# Patient Record
Sex: Female | Born: 1990 | Hispanic: Yes | Marital: Single | Smoking: Never smoker
Health system: Southern US, Community
[De-identification: ages and names within clinical notes are randomized; demographics above are authoritative.]

## PROBLEM LIST (undated history)

## (undated) ENCOUNTER — Other Ambulatory Visit

## (undated) ENCOUNTER — Encounter

## (undated) ENCOUNTER — Ambulatory Visit

## (undated) ENCOUNTER — Telehealth

## (undated) ENCOUNTER — Ambulatory Visit: Attending: Family | Primary: Family

## (undated) ENCOUNTER — Encounter: Attending: Adult Health | Primary: Adult Health

## (undated) ENCOUNTER — Ambulatory Visit: Payer: MEDICAID

## (undated) ENCOUNTER — Ambulatory Visit: Payer: PRIVATE HEALTH INSURANCE

## (undated) ENCOUNTER — Ambulatory Visit: Payer: Medicaid (Managed Care)

## (undated) ENCOUNTER — Inpatient Hospital Stay

## (undated) ENCOUNTER — Encounter
Attending: Student in an Organized Health Care Education/Training Program | Primary: Student in an Organized Health Care Education/Training Program

## (undated) ENCOUNTER — Ambulatory Visit
Payer: Medicaid (Managed Care) | Attending: Student in an Organized Health Care Education/Training Program | Primary: Student in an Organized Health Care Education/Training Program

## (undated) DIAGNOSIS — K529 Noninfective gastroenteritis and colitis, unspecified: Secondary | ICD-10-CM

## (undated) DIAGNOSIS — K649 Unspecified hemorrhoids: Secondary | ICD-10-CM

## (undated) DIAGNOSIS — F419 Anxiety disorder, unspecified: Secondary | ICD-10-CM

## (undated) DIAGNOSIS — K2970 Gastritis, unspecified, without bleeding: Secondary | ICD-10-CM

## (undated) DIAGNOSIS — D649 Anemia, unspecified: Secondary | ICD-10-CM

## (undated) DIAGNOSIS — K5120 Ulcerative (chronic) proctitis without complications: Secondary | ICD-10-CM

## (undated) HISTORY — DX: Unspecified hemorrhoids: K64.9

## (undated) HISTORY — PX: NO PAST SURGERIES: SHX2092

## (undated) HISTORY — DX: Anxiety disorder, unspecified: F41.9

## (undated) HISTORY — DX: Ulcerative (chronic) proctitis without complications: K51.20

## (undated) HISTORY — DX: Noninfective gastroenteritis and colitis, unspecified: K52.9

## (undated) HISTORY — DX: Gastritis, unspecified, without bleeding: K29.70

---
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
## 2008-12-31 ENCOUNTER — Inpatient Hospital Stay (HOSPITAL_COMMUNITY): Admission: AD | Admit: 2008-12-31 | Discharge: 2008-12-31 | Payer: Self-pay | Admitting: Obstetrics & Gynecology

## 2009-01-22 ENCOUNTER — Emergency Department (HOSPITAL_COMMUNITY): Admission: EM | Admit: 2009-01-22 | Discharge: 2009-01-22 | Payer: Self-pay | Admitting: Emergency Medicine

## 2009-07-30 ENCOUNTER — Emergency Department (HOSPITAL_COMMUNITY): Admission: EM | Admit: 2009-07-30 | Discharge: 2009-07-30 | Payer: Self-pay | Admitting: Emergency Medicine

## 2011-04-08 LAB — URINALYSIS, MICROSCOPIC ONLY
Glucose, UA: NEGATIVE mg/dL
Ketones, ur: NEGATIVE mg/dL
Nitrite: NEGATIVE
Specific Gravity, Urine: 1.023 (ref 1.005–1.030)
Urobilinogen, UA: 1 mg/dL (ref 0.0–1.0)
pH: 7 (ref 5.0–8.0)

## 2011-04-08 LAB — WET PREP, GENITAL
Clue Cells Wet Prep HPF POC: NONE SEEN
Yeast Wet Prep HPF POC: NONE SEEN

## 2011-04-08 LAB — GC/CHLAMYDIA PROBE AMP, GENITAL: GC Probe Amp, Genital: NEGATIVE

## 2011-04-08 LAB — POCT PREGNANCY, URINE: Preg Test, Ur: NEGATIVE

## 2011-04-16 LAB — POCT I-STAT, CHEM 8
BUN: 10 mg/dL (ref 6–23)
Calcium, Ion: 1.12 mmol/L (ref 1.12–1.32)
Chloride: 105 mEq/L (ref 96–112)
Creatinine, Ser: 0.5 mg/dL (ref 0.4–1.2)
Glucose, Bld: 99 mg/dL (ref 70–99)
HCT: 39 % (ref 36.0–46.0)
Potassium: 3.8 mEq/L (ref 3.5–5.1)

## 2011-04-16 LAB — URINALYSIS, ROUTINE W REFLEX MICROSCOPIC
Bilirubin Urine: NEGATIVE
Glucose, UA: NEGATIVE mg/dL
Hgb urine dipstick: NEGATIVE
Ketones, ur: NEGATIVE mg/dL
Ketones, ur: NEGATIVE mg/dL
Nitrite: NEGATIVE
Protein, ur: NEGATIVE mg/dL
Specific Gravity, Urine: 1.015 (ref 1.005–1.030)
Specific Gravity, Urine: 1.016 (ref 1.005–1.030)
Urobilinogen, UA: 0.2 mg/dL (ref 0.0–1.0)
pH: 7.5 (ref 5.0–8.0)

## 2011-04-16 LAB — URINE MICROSCOPIC-ADD ON

## 2011-04-16 LAB — CBC
Platelets: 274 10*3/uL (ref 150–400)
RBC: 3.78 MIL/uL — ABNORMAL LOW (ref 3.87–5.11)
RDW: 12.2 % (ref 11.5–15.5)

## 2011-04-16 LAB — GC/CHLAMYDIA PROBE AMP, GENITAL: GC Probe Amp, Genital: NEGATIVE

## 2011-04-16 LAB — WET PREP, GENITAL: Clue Cells Wet Prep HPF POC: NONE SEEN

## 2011-08-31 ENCOUNTER — Inpatient Hospital Stay (HOSPITAL_COMMUNITY)
Admission: AD | Admit: 2011-08-31 | Discharge: 2011-08-31 | Disposition: A | Discharge status: Home / Self Care | Payer: Self-pay | Source: Ambulatory Visit | Attending: Obstetrics and Gynecology | Admitting: Obstetrics and Gynecology

## 2011-08-31 ENCOUNTER — Encounter (HOSPITAL_COMMUNITY): Payer: Self-pay | Admitting: *Deleted

## 2011-08-31 DIAGNOSIS — N760 Acute vaginitis: Secondary | ICD-10-CM | POA: Insufficient documentation

## 2011-08-31 LAB — URINALYSIS, ROUTINE W REFLEX MICROSCOPIC
Glucose, UA: NEGATIVE mg/dL
Nitrite: NEGATIVE
Protein, ur: NEGATIVE mg/dL
Specific Gravity, Urine: >1.03 — ABNORMAL HIGH (ref 1.005–1.030)
Urobilinogen, UA: 0.2 mg/dL (ref 0.0–1.0)
pH: 5.5 (ref 5.0–8.0)

## 2011-08-31 LAB — URINE MICROSCOPIC-ADD ON

## 2011-08-31 LAB — WET PREP, GENITAL
Trich, Wet Prep: NONE SEEN
Yeast Wet Prep HPF POC: NONE SEEN

## 2011-08-31 MED ORDER — METRONIDAZOLE 500 MG PO TABS: 500.0000 mg | ORAL_TABLET | Freq: Two times a day (BID) | ORAL | Status: DC

## 2011-08-31 MED ORDER — NAPROXEN SODIUM 550 MG PO TABS: 550.0000 mg | ORAL_TABLET | Freq: Two times a day (BID) | ORAL | Status: DC

## 2011-08-31 NOTE — Progress Notes (Signed)
Pt states she has been having lower abd pain since Monday morning @ 0500, crampy, "like contractions."  Back pain since yesterday.  No bleeding, has a white discharge.

## 2011-08-31 NOTE — ED Provider Notes (Signed)
History   Pt presents today c/o lower abd pain and vag dc. She states she thinks she is pregnant because she has "heard noises in her stomach." She states she had a normal menses that ended 5 days ago.  Chief Complaint  Patient presents with  . Abdominal Pain   HPI  OB History    Grav Para Term Preterm Abortions TAB SAB Ect Mult Living   3 2 2  1  1   2       Past Medical History  Diagnosis Date  . No pertinent past medical history     Past Surgical History  Procedure Date  . No past surgeries     No family history on file.  History  Substance Use Topics  . Smoking status: Never Smoker   . Smokeless tobacco: Not on file  . Alcohol Use: No    Allergies: No Known Allergies  Prescriptions prior to admission  Medication Sig Dispense Refill  . Etonogestrel (IMPLANON) 68 MG IMPL Inject 1 each into the skin once. Pt states that she had this implanted on August 3rd.2011         Review of Systems  Constitutional: Negative for fever.  Cardiovascular: Negative for chest pain.  Gastrointestinal: Positive for abdominal pain. Negative for nausea, vomiting, diarrhea and constipation.  Genitourinary: Negative for dysuria, urgency, frequency and hematuria.  Neurological: Negative for dizziness and headaches.  Psychiatric/Behavioral: Negative for depression and suicidal ideas.   Physical Exam   Blood pressure 113/66, pulse 79, temperature 98.6 F (37 C), temperature source Oral, resp. rate 20, height 5\' 2"  (1.575 m), weight 133 lb 6.4 oz (60.51 kg), last menstrual period 08/22/2011.  Physical Exam  Constitutional: She is oriented to person, place, and time. She appears well-developed and well-nourished. No distress.  HENT:  Head: Normocephalic and atraumatic.  Eyes: EOM are normal. Pupils are equal, round, and reactive to light.  GI: Soft. She exhibits no distension. There is no tenderness. There is no rebound and no guarding.  Genitourinary: No bleeding around the vagina.  Vaginal discharge found.       Uterus is NL size and shape. No adnexal masses. Pt is non-tender on exam.  Neurological: She is alert and oriented to person, place, and time.  Skin: Skin is warm and dry. She is not diaphoretic.  Psychiatric: She has a normal mood and affect. Her behavior is normal. Judgment and thought content normal.    MAU Course  Procedures  Wet prep and GC/Chlamydia cultures done.  Results for orders placed during the hospital encounter of 08/31/11 (from the past 24 hour(s))  URINALYSIS, ROUTINE W REFLEX MICROSCOPIC     Status: Abnormal   Collection Time   08/31/11  9:00 AM      Component Value Range   Color, Urine YELLOW  YELLOW    Appearance CLEAR  CLEAR    Specific Gravity, Urine >1.030 (*) 1.005 - 1.030    pH 5.5  5.0 - 8.0    Glucose, UA NEGATIVE  NEGATIVE (mg/dL)   Hgb urine dipstick NEGATIVE  NEGATIVE    Bilirubin Urine NEGATIVE  NEGATIVE    Ketones, ur NEGATIVE  NEGATIVE (mg/dL)   Protein, ur NEGATIVE  NEGATIVE (mg/dL)   Urobilinogen, UA 0.2  0.0 - 1.0 (mg/dL)   Nitrite NEGATIVE  NEGATIVE    Leukocytes, UA SMALL (*) NEGATIVE   URINE MICROSCOPIC-ADD ON     Status: Abnormal   Collection Time   08/31/11  9:00 AM  Component Value Range   Squamous Epithelial / LPF MANY (*) RARE    WBC, UA 11-20  <3 (WBC/hpf)  POCT PREGNANCY, URINE     Status: Normal   Collection Time   08/31/11  9:15 AM      Component Value Range   Preg Test, Ur NEGATIVE    WET PREP, GENITAL     Status: Abnormal   Collection Time   08/31/11  9:35 AM      Component Value Range   Yeast, Wet Prep NONE SEEN  NONE SEEN    Trich, Wet Prep NONE SEEN  NONE SEEN    Clue Cells, Wet Prep NONE SEEN  NONE SEEN    WBC, Wet Prep HPF POC MANY (*) NONE SEEN    Urine sent for culture.  Assessment and Plan  Abd pain/vaginitis: discussed with pt at length. Will give Rx for flagyl and anaprox ds. Warned of antabuse reaction. Discussed diet, activity, risks, and precautions. She will f/u with her  PCP.  Clinton Gallant. Rice III, DrHSc, MPAS, PA-C  08/31/2011, 9:35 AM

## 2011-08-31 NOTE — Progress Notes (Signed)
Pains started in lower abd  On Mon morning.  Have cont, feels crampy like contractions.  Bled 5 days stopped on Sun.

## 2011-08-31 NOTE — Progress Notes (Signed)
Some nausea, has been constipated.  Hears a noise in her stomach

## 2011-09-01 LAB — URINE CULTURE: Colony Count: NO GROWTH

## 2011-09-04 ENCOUNTER — Inpatient Hospital Stay (HOSPITAL_COMMUNITY)
Admission: AD | Admit: 2011-09-04 | Discharge: 2011-09-04 | Disposition: A | Discharge status: Home / Self Care | Payer: Self-pay | Source: Ambulatory Visit | Attending: Obstetrics & Gynecology | Admitting: Obstetrics & Gynecology

## 2011-09-04 ENCOUNTER — Encounter (HOSPITAL_COMMUNITY): Payer: Self-pay | Admitting: *Deleted

## 2011-09-04 DIAGNOSIS — A088 Other specified intestinal infections: Secondary | ICD-10-CM | POA: Insufficient documentation

## 2011-09-04 DIAGNOSIS — K5289 Other specified noninfective gastroenteritis and colitis: Secondary | ICD-10-CM

## 2011-09-04 DIAGNOSIS — N390 Urinary tract infection, site not specified: Secondary | ICD-10-CM

## 2011-09-04 DIAGNOSIS — K529 Noninfective gastroenteritis and colitis, unspecified: Secondary | ICD-10-CM

## 2011-09-04 LAB — CBC
HCT: 31.5 % — ABNORMAL LOW (ref 36.0–46.0)
Hemoglobin: 10.1 g/dL — ABNORMAL LOW (ref 12.0–15.0)
MCH: 25.5 pg — ABNORMAL LOW (ref 26.0–34.0)
MCHC: 32.1 g/dL (ref 30.0–36.0)
MCV: 79.5 fL (ref 78.0–100.0)
RDW: 15 % (ref 11.5–15.5)

## 2011-09-04 LAB — COMPREHENSIVE METABOLIC PANEL
AST: 14 U/L (ref 0–37)
Albumin: 3.2 g/dL — ABNORMAL LOW (ref 3.5–5.2)
BUN: 12 mg/dL (ref 6–23)
Calcium: 8.6 mg/dL (ref 8.4–10.5)
Chloride: 108 mEq/L (ref 96–112)
Creatinine, Ser: 0.59 mg/dL (ref 0.50–1.10)
Total Bilirubin: 0.1 mg/dL — ABNORMAL LOW (ref 0.3–1.2)
Total Protein: 6.6 g/dL (ref 6.0–8.3)

## 2011-09-04 LAB — URINALYSIS, ROUTINE W REFLEX MICROSCOPIC
Glucose, UA: 100 mg/dL — AB
Ketones, ur: 15 mg/dL — AB
Leukocytes, UA: NEGATIVE
Nitrite: POSITIVE — AB
Protein, ur: NEGATIVE mg/dL

## 2011-09-04 LAB — URINE MICROSCOPIC-ADD ON

## 2011-09-04 LAB — DIFFERENTIAL
Basophils Absolute: 0 10*3/uL (ref 0.0–0.1)
Basophils Relative: 0 % (ref 0–1)
Eosinophils Absolute: 0.5 10*3/uL (ref 0.0–0.7)
Eosinophils Relative: 5 % (ref 0–5)
Monocytes Absolute: 1 10*3/uL (ref 0.1–1.0)
Monocytes Relative: 9 % (ref 3–12)
Neutro Abs: 7.3 10*3/uL (ref 1.7–7.7)

## 2011-09-04 LAB — POCT PREGNANCY, URINE: Preg Test, Ur: NEGATIVE

## 2011-09-04 MED ORDER — LOPERAMIDE HCL 2 MG PO CAPS
2.0000 mg | ORAL_CAPSULE | Freq: Once | ORAL | Status: AC
  Administered 2011-09-04: 2 mg via ORAL
  Filled 2011-09-04: qty 1

## 2011-09-04 MED ORDER — LACTATED RINGERS IV BOLUS (SEPSIS)
1000.0000 mL | Freq: Once | INTRAVENOUS | Status: AC
  Administered 2011-09-04 (×2): 1000 mL via INTRAVENOUS

## 2011-09-04 MED ORDER — LOPERAMIDE HCL 2 MG PO CAPS
4.0000 mg | ORAL_CAPSULE | Freq: Once | ORAL | Status: AC
  Administered 2011-09-04: 4 mg via ORAL
  Filled 2011-09-04: qty 2

## 2011-09-04 MED ORDER — CEPHALEXIN 500 MG PO CAPS: 500.0000 mg | ORAL_CAPSULE | Freq: Four times a day (QID) | ORAL | Status: AC

## 2011-09-04 NOTE — ED Notes (Signed)
Adam, PA-S in to see patient.

## 2011-09-04 NOTE — ED Notes (Signed)
Stools documented in occurrence, not amount. Pt. Has had 2 loose stools since arrival to MAU. First stool was bloody; noted by tech and RN. 2nd stool, patient states she saw no apparent blood. C/O stomach cramps.

## 2011-09-04 NOTE — Progress Notes (Signed)
Pain in abd. , nausea, bloody diarrhea, dizziness, pain with urination X 4 days. States has had pain for 10 days. Was seen in MAu, given antibiotic.

## 2011-09-04 NOTE — Progress Notes (Signed)
Pt states she was seen in MAU on 8-31 and treated for some type of infection, pt is not sure what, and given pain medication. Pt states she is having abdominal pain not relieved by the pain med, nausea and vomiting and bloody diarrhea multiple times each day.

## 2011-09-04 NOTE — ED Provider Notes (Signed)
History   Pt presents today c/o bloody diarrhea and abd cramping for the past 4 days. She was recently seen for vaginitis and abd pain and treated with Flagyl. She denies fever but does c/o some nausea.  Chief Complaint  Patient presents with  . Abdominal Pain   HPI  OB History    Grav Para Term Preterm Abortions TAB SAB Ect Mult Living   3 2 2  1  1   2       Past Medical History  Diagnosis Date  . No pertinent past medical history     Past Surgical History  Procedure Date  . No past surgeries     No family history on file.  History  Substance Use Topics  . Smoking status: Never Smoker   . Smokeless tobacco: Never Used  . Alcohol Use: No    Allergies: No Known Allergies  Prescriptions prior to admission  Medication Sig Dispense Refill  . Etonogestrel (IMPLANON) 68 MG IMPL Inject 1 each into the skin once. Pt states that she had this implanted on August 3rd.2011       . metroNIDAZOLE (FLAGYL) 500 MG tablet Take 500 mg by mouth 2 (two) times daily.        . naproxen sodium (ANAPROX) 550 MG tablet Take 550 mg by mouth 2 (two) times daily with a meal.        . DISCONTD: metroNIDAZOLE (FLAGYL) 500 MG tablet Take 1 tablet (500 mg total) by mouth 2 (two) times daily.  14 tablet  0  . DISCONTD: naproxen sodium (ANAPROX DS) 550 MG tablet Take 1 tablet (550 mg total) by mouth 2 (two) times daily with a meal.  30 tablet  0    Review of Systems  Constitutional: Negative for fever and chills.  Respiratory: Negative for cough, hemoptysis, sputum production and shortness of breath.   Cardiovascular: Negative for chest pain and palpitations.  Gastrointestinal: Positive for nausea, abdominal pain, diarrhea and blood in stool. Negative for vomiting.  Genitourinary: Negative for dysuria, urgency, frequency and hematuria.  Neurological: Negative for dizziness and headaches.  Psychiatric/Behavioral: Negative for depression and suicidal ideas.   Physical Exam   Blood pressure 98/57,  pulse 80, temperature 99 F (37.2 C), temperature source Oral, resp. rate 20, height 5\' 2"  (1.575 m), weight 136 lb (61.689 kg), last menstrual period 08/22/2011, SpO2 100.00%.  Physical Exam  Constitutional: She is oriented to person, place, and time. She appears well-developed and well-nourished. No distress.  HENT:  Head: Normocephalic and atraumatic.  GI: Soft. She exhibits no distension and no mass. There is tenderness in the suprapubic area. There is no rigidity, no rebound, no guarding, no CVA tenderness, no tenderness at McBurney's point and negative Murphy's sign.  Genitourinary:       Rectal exam NL with no obvious blood. No evidence of hemorrhoids or fissures.  Neurological: She is alert and oriented to person, place, and time.  Skin: Skin is warm and dry. She is not diaphoretic.  Psychiatric: She has a normal mood and affect. Her behavior is normal. Thought content normal.    MAU Course  Procedures  Results for orders placed during the hospital encounter of 09/04/11 (from the past 24 hour(s))  URINALYSIS, ROUTINE W REFLEX MICROSCOPIC     Status: Abnormal   Collection Time   09/04/11 10:00 AM      Component Value Range   Color, Urine YELLOW  YELLOW    Appearance CLEAR  CLEAR  Specific Gravity, Urine >1.030 (*) 1.005 - 1.030    pH 5.5  5.0 - 8.0    Glucose, UA 100 (*) NEGATIVE (mg/dL)   Hgb urine dipstick NEGATIVE  NEGATIVE    Bilirubin Urine NEGATIVE  NEGATIVE    Ketones, ur 15 (*) NEGATIVE (mg/dL)   Protein, ur NEGATIVE  NEGATIVE (mg/dL)   Urobilinogen, UA 0.2  0.0 - 1.0 (mg/dL)   Nitrite POSITIVE (*) NEGATIVE    Leukocytes, UA NEGATIVE  NEGATIVE   COMPREHENSIVE METABOLIC PANEL     Status: Abnormal   Collection Time   09/04/11 10:00 AM      Component Value Range   Sodium 140  135 - 145 (mEq/L)   Potassium 3.8  3.5 - 5.1 (mEq/L)   Chloride 108  96 - 112 (mEq/L)   CO2 28  19 - 32 (mEq/L)   Glucose, Bld 88  70 - 99 (mg/dL)   BUN 12  6 - 23 (mg/dL)   Creatinine, Ser  1.61  0.50 - 1.10 (mg/dL)   Calcium 8.6  8.4 - 09.6 (mg/dL)   Total Protein 6.6  6.0 - 8.3 (g/dL)   Albumin 3.2 (*) 3.5 - 5.2 (g/dL)   AST 14  0 - 37 (U/L)   ALT 8  0 - 35 (U/L)   Alkaline Phosphatase 92  39 - 117 (U/L)   Total Bilirubin 0.1 (*) 0.3 - 1.2 (mg/dL)   GFR calc non Af Amer >60  >60 (mL/min)   GFR calc Af Amer >60  >60 (mL/min)  URINE MICROSCOPIC-ADD ON     Status: Abnormal   Collection Time   09/04/11 10:00 AM      Component Value Range   Squamous Epithelial / LPF MANY (*) RARE    WBC, UA 3-6  <3 (WBC/hpf)   RBC / HPF 7-10  <3 (RBC/hpf)   Bacteria, UA FEW (*) RARE   CBC     Status: Abnormal   Collection Time   09/04/11 10:10 AM      Component Value Range   WBC 10.2  4.0 - 10.5 (K/uL)   RBC 3.96  3.87 - 5.11 (MIL/uL)   Hemoglobin 10.1 (*) 12.0 - 15.0 (g/dL)   HCT 04.5 (*) 40.9 - 46.0 (%)   MCV 79.5  78.0 - 100.0 (fL)   MCH 25.5 (*) 26.0 - 34.0 (pg)   MCHC 32.1  30.0 - 36.0 (g/dL)   RDW 81.1  91.4 - 78.2 (%)   Platelets 313  150 - 400 (K/uL)  DIFFERENTIAL     Status: Normal   Collection Time   09/04/11 10:10 AM      Component Value Range   Neutrophils Relative 72  43 - 77 (%)   Neutro Abs 7.3  1.7 - 7.7 (K/uL)   Lymphocytes Relative 14  12 - 46 (%)   Lymphs Abs 1.4  0.7 - 4.0 (K/uL)   Monocytes Relative 9  3 - 12 (%)   Monocytes Absolute 1.0  0.1 - 1.0 (K/uL)   Eosinophils Relative 5  0 - 5 (%)   Eosinophils Absolute 0.5  0.0 - 0.7 (K/uL)   Basophils Relative 0  0 - 1 (%)   Basophils Absolute 0.0  0.0 - 0.1 (K/uL)  POCT PREGNANCY, URINE     Status: Normal   Collection Time   09/04/11 10:43 AM      Component Value Range   Preg Test, Ur NEGATIVE      Stool culture done.  Urine  sent for culture. Assessment and Plan  Viral gastroenteritis: discussed with pt at length. She will continue with OTC Imodium at home. She will be contacted if she has a positive stool culture.  UTI: will give Rx for keflex. Will await urine culture. Discussed diet, activity, risks,  and precautions.  Clinton Gallant. Rice III, DrHSc, MPAS, PA-C  09/04/2011, 1:03 PM   Henrietta Hoover, PA 09/04/11 1308

## 2011-09-05 LAB — URINE CULTURE: Colony Count: 30000

## 2011-09-06 NOTE — ED Provider Notes (Signed)
Agree with above note.  Linda Taylor H. 09/06/2011 11:21 AM

## 2011-09-07 LAB — STOOL CULTURE

## 2011-09-13 NOTE — ED Provider Notes (Signed)
Agree with above note.  Linda Taylor 09/13/2011 8:32 AM

## 2011-09-23 ENCOUNTER — Emergency Department (HOSPITAL_COMMUNITY): Payer: Self-pay

## 2011-09-23 ENCOUNTER — Emergency Department (HOSPITAL_COMMUNITY)
Admission: EM | Admit: 2011-09-23 | Discharge: 2011-09-23 | Disposition: A | Discharge status: Home / Self Care | Payer: Self-pay | Attending: Emergency Medicine | Admitting: Emergency Medicine

## 2011-09-23 DIAGNOSIS — N898 Other specified noninflammatory disorders of vagina: Secondary | ICD-10-CM | POA: Insufficient documentation

## 2011-09-23 DIAGNOSIS — R109 Unspecified abdominal pain: Secondary | ICD-10-CM | POA: Insufficient documentation

## 2011-09-23 LAB — URINALYSIS, ROUTINE W REFLEX MICROSCOPIC
Ketones, ur: 15 mg/dL — AB
Nitrite: NEGATIVE
Protein, ur: 30 mg/dL — AB
pH: 5 (ref 5.0–8.0)

## 2011-09-23 LAB — WET PREP, GENITAL

## 2011-09-23 LAB — URINE MICROSCOPIC-ADD ON

## 2011-09-23 LAB — POCT PREGNANCY, URINE: Preg Test, Ur: NEGATIVE

## 2011-09-24 LAB — URINE CULTURE
Colony Count: 8000
Culture  Setup Time: 201209231126

## 2011-10-15 LAB — CBC
HCT: 35.2 — ABNORMAL LOW
HCT: 39.3
Hemoglobin: 13.5
MCHC: 34.3
MCHC: 34.3
MCV: 91.3
MCV: 91.5
Platelets: 191
RBC: 4.3
RDW: 14.1 — ABNORMAL HIGH

## 2011-10-15 LAB — RH IMMUNE GLOB WKUP(>/=20WKS)(NOT WOMEN'S HOSP)

## 2012-04-10 ENCOUNTER — Inpatient Hospital Stay (HOSPITAL_COMMUNITY)
Admission: AD | Admit: 2012-04-10 | Discharge: 2012-04-10 | Disposition: A | Discharge status: Home / Self Care | Payer: Self-pay | Source: Ambulatory Visit | Attending: Obstetrics and Gynecology | Admitting: Obstetrics and Gynecology

## 2012-04-10 ENCOUNTER — Encounter (HOSPITAL_COMMUNITY): Payer: Self-pay | Admitting: *Deleted

## 2012-04-10 DIAGNOSIS — N946 Dysmenorrhea, unspecified: Secondary | ICD-10-CM

## 2012-04-10 DIAGNOSIS — N949 Unspecified condition associated with female genital organs and menstrual cycle: Secondary | ICD-10-CM | POA: Insufficient documentation

## 2012-04-10 LAB — URINALYSIS, ROUTINE W REFLEX MICROSCOPIC
Glucose, UA: NEGATIVE mg/dL
Ketones, ur: NEGATIVE mg/dL
Protein, ur: NEGATIVE mg/dL
Urobilinogen, UA: 0.2 mg/dL (ref 0.0–1.0)

## 2012-04-10 LAB — WET PREP, GENITAL
Trich, Wet Prep: NONE SEEN
Yeast Wet Prep HPF POC: NONE SEEN

## 2012-04-10 LAB — POCT PREGNANCY, URINE: Preg Test, Ur: NEGATIVE

## 2012-04-10 MED ORDER — KETOROLAC TROMETHAMINE 60 MG/2ML IM SOLN
60.0000 mg | Freq: Once | INTRAMUSCULAR | Status: AC
  Administered 2012-04-10: 60 mg via INTRAMUSCULAR
  Filled 2012-04-10: qty 2

## 2012-04-10 NOTE — MAU Provider Note (Signed)
History     CSN: [id number]  Arrival date and time: 04/10/12 4098   First Provider Initiated Contact with Patient 04/10/12 1026      Chief Complaint  Patient presents with  . Vaginal Bleeding  . Abdominal Pain   HPI Linda Taylor is 21 y.o. [id number] presents for heavy vaginal bleeding with this period.  This period is on time and heavier than her normal cycles.  She is also having a jelly like discharge from her rectum that is bloody.  Denies constipation/diarrhea.  States bowel movements are normal.  She is bloated and having sharp abdominal pain.  Denies nausea and vomiting.  Lightheadedness.  Ate today at 7:30.  Has not tried anything for pain. Uses Implanon for contraception X 2 years.      Past Medical History  Diagnosis Date  . No pertinent past medical history     Past Surgical History  Procedure Date  . No past surgeries     History reviewed. No pertinent family history.  History  Substance Use Topics  . Smoking status: Never Smoker   . Smokeless tobacco: Never Used  . Alcohol Use: No    Allergies: No Known Allergies  Prescriptions prior to admission  Medication Sig Dispense Refill  . Etonogestrel (IMPLANON) 68 MG IMPL Inject 1 each into the skin once. Pt states that she had this implanted on August 3rd.2011       . naproxen sodium (ANAPROX) 550 MG tablet Take 550 mg by mouth 2 (two) times daily with a meal.          Review of Systems  Constitutional: Negative for fever and chills.  Gastrointestinal: Positive for abdominal pain. Negative for diarrhea and constipation.       + for rectal bleeding X 5 years  Genitourinary:       + for menstrual cramps   Physical Exam   Blood pressure 113/66, pulse 65, temperature 97.8 F (36.6 C), temperature source Oral, resp. rate 18, height 5\' 2"  (1.575 m), weight 67.586 kg (149 lb), last menstrual period 04/06/2012.  Physical Exam  Constitutional: She is oriented to person, place, and time. She appears  well-developed and well-nourished.  Neck: Normal range of motion.  Cardiovascular: Normal rate.   Respiratory: Effort normal.  GI: Soft. She exhibits no mass. There is tenderness (lower mid abdomen). There is no rebound and no guarding.  Genitourinary: Rectum normal. Rectal exam shows no external hemorrhoid and no tenderness. Uterus is tender. Uterus is not enlarged. Cervix exhibits no discharge. Right adnexum displays no mass, no tenderness and no fullness. Left adnexum displays no mass, no tenderness and no fullness. There is bleeding around the vagina.       + for menstrual bleeding.  Neurological: She is alert and oriented to person, place, and time.  Skin: Skin is warm and dry.  Psychiatric: She has a normal mood and affect. Her behavior is normal.   Results for orders placed during the hospital encounter of 04/10/12 (from the past 24 hour(s))  URINALYSIS, ROUTINE W REFLEX MICROSCOPIC     Status: Abnormal   Collection Time   04/10/12  9:47 AM      Component Value Range   Color, Urine YELLOW  YELLOW    APPearance CLEAR  CLEAR    Specific Gravity, Urine >1.030 (*) 1.005 - 1.030    pH 6.0  5.0 - 8.0    Glucose, UA NEGATIVE  NEGATIVE (mg/dL)   Hgb urine dipstick MODERATE (*) NEGATIVE  Bilirubin Urine NEGATIVE  NEGATIVE    Ketones, ur NEGATIVE  NEGATIVE (mg/dL)   Protein, ur NEGATIVE  NEGATIVE (mg/dL)   Urobilinogen, UA 0.2  0.0 - 1.0 (mg/dL)   Nitrite NEGATIVE  NEGATIVE    Leukocytes, UA NEGATIVE  NEGATIVE   URINE MICROSCOPIC-ADD ON     Status: Abnormal   Collection Time   04/10/12  9:47 AM      Component Value Range   Squamous Epithelial / LPF FEW (*) RARE    RBC / HPF 7-10  <3 (RBC/hpf)  POCT PREGNANCY, URINE     Status: Normal   Collection Time   04/10/12 10:00 AM      Component Value Range   Preg Test, Ur NEGATIVE  NEGATIVE   WET PREP, GENITAL     Status: Abnormal   Collection Time   04/10/12 10:47 AM      Component Value Range   Yeast Wet Prep HPF POC NONE SEEN  NONE  SEEN    Trich, Wet Prep NONE SEEN  NONE SEEN    Clue Cells Wet Prep HPF POC NONE SEEN  NONE SEEN    WBC, Wet Prep HPF POC FEW (*) NONE SEEN     MAU Course  Procedures  GC/CHL culture to lab  MDM Toradol 60mg  IM ordered for pain 11:45  Patient states her pain is much less after Toradol injection.  Ready for discharge.  Assessment and Plan  A:  Dysmenorrhea      Hx of rectal bleeding without followup as instructed by precious provider (per patient report)  P:  May take Ibuprofen as needed for pain        Phone number for [facility] given to patient to make appointment    Sully Manzi,EVE M 04/10/2012, 10:29 AM

## 2012-04-10 NOTE — MAU Note (Signed)
Pt in c/o lower abdominal pain and bleeding since Sunday, today is worse.  States it is time for period but states period is much heavier than normal.  Reports blood in stool x5 years.

## 2012-04-10 NOTE — Discharge Instructions (Signed)
Dysmenorrhea Menstrual pain is caused by the muscles of the uterus tightening (contracting) during a menstrual period. The muscles of the uterus contract due to the chemicals in the uterine lining. Primary dysmenorrhea is menstrual cramps that last a couple of days when you start having menstrual periods or soon after. This often begins after a teenager starts having her period. As a woman gets older or has a baby, the cramps will usually lesson or disappear. Secondary dysmenorrhea begins later in life, lasts longer, and the pain may be stronger than primary dysmenorrhea. The pain may start before the period and last a few days after the period. This type of dysmenorrhea is usually caused by an underlying problem such as:  The tissue lining the uterus grows outside of the uterus in other areas of the body (endometriosis).   The endometrial tissue, which normally lines the uterus, is found in or grows into the muscular walls of the uterus (adenomyosis).   The pelvic blood vessels are engorged with blood just before the menstrual period (pelvic congestive syndrome).   Overgrowth of cells in the lining of the uterus or cervix (polyps of the uterus or cervix).   Falling down of the uterus (prolapse) because of loose or stretched ligaments.   Depression.   Bladder problems, infection, or inflammation.   Problems with the intestine, a tumor, or irritable bowel syndrome.   Cancer of the female organs or bladder.   A severely tipped uterus.   A very tight opening or closed cervix.   Noncancerous tumors of the uterus (fibroids).   Pelvic inflammatory disease (PID).   Pelvic scarring (adhesions) from a previous surgery.   Ovarian cyst.   An intrauterine device (IUD) used for birth control.  CAUSES  The cause of menstrual pain is often unknown. SYMPTOMS   Cramping or throbbing pain in your lower abdomen.   Sometimes, a woman may also experience headaches.   Lower back pain.    Feeling sick to your stomach (nausea) or vomiting.   Diarrhea.   Sweating or dizziness.  DIAGNOSIS  A diagnosis is based on your history, symptoms, physical examination, diagnostic tests, or procedures. Diagnostic tests or procedures may include:  Blood tests.   An ultrasound.   An examination of the lining of the uterus (dilation and curettage, D&C).   An examination inside your abdomen or pelvis with a scope (laparoscopy).   X-rays.   CT Scan.   MRI.   An examination inside the bladder with a scope (cystoscopy).   An examination inside the intestine or stomach with a scope (colonoscopy, gastroscopy).  TREATMENT  Treatment depends on the cause of the dysmenorrhea. Treatment may include:  Pain medicine prescribed by your caregiver.   Birth control pills.   Hormone replacement therapy.   Nonsteroidal anti-inflammatory drugs (NSAIDs). These may help stop the production of prostaglandins.   An IUD with progesterone hormone in it.   Acupuncture.   Surgery to remove adhesions, endometriosis, ovarian cyst, or fibroids.   Removal of the uterus (hysterectomy).   Progesterone shots to stop the menstrual period.   Cutting the nerves on the sacrum that go to the female organs (presacral neurectomy).   Electric currant to the sacral nerves (sacral nerve stimulation).   Antidepressant medicine.   Psychiatric therapy, counseling, or group therapy.   Exercise and physical therapy.   Meditation and yoga therapy.  HOME CARE INSTRUCTIONS   Only take over-the-counter or prescription medicines for pain, discomfort, or fever as directed by your   caregiver.   Place a heating pad or hot water bottle on your lower back or abdomen. Do not sleep with the heating pad.   Use aerobic exercises, walking, swimming, biking, and other exercises to help lessen the cramping.   Massage to the lower back or abdomen may help.   Stop smoking.   Avoid alcohol and caffeine.   Yoga,  meditation, or acupuncture may help.  SEEK MEDICAL CARE IF:   The pain does not get better with medicine.   You have pain with sexual intercourse.  SEEK IMMEDIATE MEDICAL CARE IF:   Your pain increases and is not controlled with medicines.   You have a fever.   You develop nausea or vomiting with your period not controlled with medicine.   You have abnormal vaginal bleeding with your period.   You pass out.  MAKE SURE YOU:   Understand these instructions.   Will watch your condition.   Will get help right away if you are not doing well or get worse.  Document Released: 12/17/2005 Document Revised: 12/06/2011 Document Reviewed: 04/04/2009 [employer] Patient Information 2012 [employer]. 

## 2012-04-11 LAB — GC/CHLAMYDIA PROBE AMP, GENITAL
Chlamydia, DNA Probe: NEGATIVE
GC Probe Amp, Genital: NEGATIVE

## 2012-04-14 NOTE — MAU Provider Note (Signed)
Agree with above note.  Linda Taylor 04/14/2012 1:40 PM

## 2012-05-12 ENCOUNTER — Encounter: Payer: Self-pay | Admitting: Family Medicine

## 2012-05-12 ENCOUNTER — Ambulatory Visit (INDEPENDENT_AMBULATORY_CARE_PROVIDER_SITE_OTHER): Payer: Self-pay | Admitting: Family Medicine

## 2012-05-12 VITALS — BP 113/65 | HR 70 | Temp 98.3°F | Ht 62.0 in | Wt 143.0 lb

## 2012-05-12 DIAGNOSIS — L709 Acne, unspecified: Secondary | ICD-10-CM | POA: Insufficient documentation

## 2012-05-12 DIAGNOSIS — N61 Mastitis without abscess: Secondary | ICD-10-CM

## 2012-05-12 DIAGNOSIS — K5120 Ulcerative (chronic) proctitis without complications: Secondary | ICD-10-CM | POA: Insufficient documentation

## 2012-05-12 DIAGNOSIS — K625 Hemorrhage of anus and rectum: Secondary | ICD-10-CM

## 2012-05-12 DIAGNOSIS — L708 Other acne: Secondary | ICD-10-CM

## 2012-05-12 DIAGNOSIS — N611 Abscess of the breast and nipple: Secondary | ICD-10-CM | POA: Insufficient documentation

## 2012-05-12 DIAGNOSIS — N63 Unspecified lump in unspecified breast: Secondary | ICD-10-CM

## 2012-05-12 NOTE — Assessment & Plan Note (Signed)
Small abscess. No systemic symptoms. Too small to drain/antibiotics.  Recommended warm compresses.

## 2012-05-12 NOTE — Patient Instructions (Signed)
It was great to see you today!  Schedule an appointment to see me as needed.  

## 2012-05-12 NOTE — Progress Notes (Signed)
  Subjective:   Patient ID: Linda Taylor, female DOB: 02/02/1991 21 y.o. MRN: [id number] HPI:  1. Rectal bleeding  Onset: has been chronic  Time period of: 5 year(s).  Severity is described as mild.  Course of her symptoms over time is chronic and constant. Aggravating: does not know what causes it.  Alleviating: unknown Associated sx/sn: mucous present with BM, occasional constipation.  No abdominal pain, no fever, no food allergies, no relation of symptoms to food. C/o gassy diarrhea at times. Never had colonoscopy.  No weight loss. 10 lbs weight gain since last year.  No fam. History of colon ca/inflammatory bowel disease. No rashes.   2. Right breat lump Small 0.5x0.5 cm abscess in the right breast lateral to the areola.  Onset: has been acute  Time period of: 3 day(s).  Severity is described as mild.  Course of her symptoms over time is acute. Aggravating: palpation Alleviating: none Associated sx/sn: no fevers, no weight loss.   History  Substance Use Topics  . Smoking status: Never Smoker   . Smokeless tobacco: Never Used  . Alcohol Use: No   Review of Systems: Pertinent items are noted in HPI. No abdominal pain, no fever, no food allergies, no relation of symptoms to food. C/o gassy diarrhea at times. Never had colonoscopy.  No weight loss. 10 lbs weight gain since last year.  No fam. History of colon ca/inflammatory bowel disease. No rashes.   Labs Reviewed: reviewed CBC from 2012. H/H 10/33    Objective:   Filed Vitals:   05/12/12 1613  BP: 113/65  Pulse: 70  Temp: 98.3 F (36.8 C)  TempSrc: Oral  Height: 5\' 2"  (1.575 m)  Weight: 143 lb (64.864 kg)   Physical Exam: General: hispanic female, acne, pleasant.  Anoscope: small 1 oclock internal hemorrhoid with punctate bleeding areas. No fissure, nontender exam.  Breast exam: Small 0.5x0.5 cm abscess in the right breast lateral to the areola.   Abdomen: soft and non-tender without masses,  organomegaly or hernias noted.  No guarding or rebound Extremities:   Non-tender, No cyanosis, edema, or deformity noted. Skin:  Intact without suspicious lesions or rashes  Assessment & Plan:

## 2012-05-12 NOTE — Assessment & Plan Note (Signed)
Avm, vs. Diverticulosis, vs. Juvenile polyp, vs. Internal hemmorhoid. Anoscope revealed small internal hemorrhoid. Patient should see GI again for a colonoscopy.

## 2012-05-19 ENCOUNTER — Ambulatory Visit (INDEPENDENT_AMBULATORY_CARE_PROVIDER_SITE_OTHER): Payer: Self-pay | Admitting: Family Medicine

## 2012-05-19 ENCOUNTER — Encounter: Payer: Self-pay | Admitting: Family Medicine

## 2012-05-19 ENCOUNTER — Other Ambulatory Visit: Payer: Self-pay | Admitting: Family Medicine

## 2012-05-19 VITALS — BP 116/60 | Temp 99.1°F | Ht 62.0 in | Wt 145.0 lb

## 2012-05-19 DIAGNOSIS — L709 Acne, unspecified: Secondary | ICD-10-CM

## 2012-05-19 DIAGNOSIS — L708 Other acne: Secondary | ICD-10-CM

## 2012-05-19 DIAGNOSIS — K625 Hemorrhage of anus and rectum: Secondary | ICD-10-CM

## 2012-05-19 MED ORDER — CLINDAMYCIN PHOS-BENZOYL PEROX 1-5 % EX GEL: Freq: Two times a day (BID) | CUTANEOUS | Status: DC

## 2012-05-19 NOTE — Patient Instructions (Signed)
Acn (Acne) El acn es un problema de la piel que causa granos. Aparece cuando los poros de la piel se obstruyen. Los poros Insurance account manager, hincharse y Cabin crew (inflamarse) o infectarse con una bacteria que habitualmente se encuentra en la piel (Propionibacterium acnes). El acn es un trastorno comn de la piel. El 80% de las personas sufre acn en algn momento. Es especialmente frecuente [employer] [address] y los [address]. Generalmente desaparece despus de un tiempo con el tratamiento adecuado.   CAUSAS Cada uno de los poros contiene una glndula sebcea. Esta glndula sebcea produce una sustancia grasosa llamada sebo. El acn aparece cuando estas glndulas se obstruyen con sebo, clulas de la piel muertas y suciedad. Entonces las bacterias P. acnes que normalmente se encuentran en las glndulas sebceas se multiplican y causan inflamacin. Con frecuencia el acn es originado por cambios hormonales. Estos cambios [employer] las glndulas sebceas se agranden y produzcan ms sebo. Los factores que empeoran el acn son:    Cambios hormonales durante la adolescencia.   Cambios hormonales en los ciclos menstruales femeninos.   Cambios hormonales Academic librarian.   Cosmticos y productos para el cabello con base de aceites.   Restregarse vigorosamente la piel.   Jabones fuertes.   Estrs.   Problemas hormonales debidos a ciertas enfermedades.   Cabello largo o grasoso que toca la piel.   Algunos medicamentos.   Presin por vinchas, mochilas u hombreras.   Exposicin a ciertos aceites y sustancias qumicas.  SNTOMAS El acn aparece con ms frecuencia en el rostro, el cuello, el pecho y la parte superior de la espalda. Los sntomas son:    Bultos pequeos, rojos(granos o ppulas).   Barritos (comedones cerrados).   Espinillas (comedones abiertos).   Granos pequeos llenos de pus (pstulas).   Granos o pstulas grandes y rojas que duelen.  El acn ms grave puede causar:     Infeccin en una zona en la que se acumula pus (absceso).   Sacos duros, dolorosos y llenos de lquido (quistes).   Cicatrices.  DIAGNSTICO   El mdico puede diagnosticar el problema haciendo un examen fsico.   TRATAMIENTO Existen muchos tratamientos buenos para el acn. Algunos estn disponibles como medicamentos de venta libre y otros son recetados. El mejor tratamiento para usted depende del tipo de acn que presente y de su gravedad. Puede llevar 2 meses de tratamiento antes de que el acn comience a mejorar. Los tratamientos ms comunes son:    Control and instrumentation engineer y lociones que evitan que las glndulas sebceas se obstruyan.   Cremas y lociones para tratar o prevenir infecciones e inflamacin.   Antibiticos-sobre la piel o por va oral.   Comprimidos que disminuyan la produccin de cebo.   Anticonceptivos.   Luces especiales o rayo lser.   Ciruga menor.   Medicamentos inyectables en las zonas de acn.   Sustancias qumicas que produzcan un peeling en la piel.  INSTRUCCIONES PARA EL CUIDADO DOMICILIARIO Un buen cuidado de la piel es lo ms importante del tratamiento.    Lave la piel suavemente al [employer] veces por da y luego de Education administrator actividad fsica. Limpie su piel siempre antes de irse a dormir.   Utilice un jabn suave.   Despus de lavarse, aplique una crema humectante a base de agua.   Mantenga el cabello limpio y fuera del rostro. Lvelo todos los das con [address].   Tome slo la medicacin que le indic el profesional.   Use pantalla solar con [employer]  30  ms. Esto es muy importante si toma medicamentos para curar el acn.   Elija cosmticos no comedognicos. Esto significa que no obstruyan las glndulas sebceas.   Evite apoyar la barbilla o la frente en las manos.   Evite el uso de vinchas o sombreros apretados.   Evite rascarse o apretar los granos. Esto puede hacer que el acn empeore y cause cicatrices.  SOLICITE ANTENCIN MDICA SI:  El acn no  mejora en 8 semanas.   El acn [place].   Observa una zona mayor de piel est roja o duele.  Document Released: 12/17/2005 Document Revised: 12/06/2011 [facility] Patient Information 2012 [place], [place].

## 2012-05-19 NOTE — Progress Notes (Signed)
  Subjective:   Patient ID: Linda Taylor, female DOB: 08-10-1991 21 y.o. MRN: [id number] HPI:  1. Acne vulgaris Synopsis: patient states she had no acne as a teen. Started when she was 20-19, 1.5 years ago. She also had Implanon placed 2 years ago.   Location: face, chest and back.  Onset: has been 1.5 years ago  Severity is described as moderate.  Aggravating: possibly the implanon Alleviating: oxy clean 10% Associated sx/sn: none  History  Substance Use Topics  . Smoking status: Never Smoker   . Smokeless tobacco: Never Used  . Alcohol Use: No    Review of Systems: Pertinent items are noted in HPI.  Labs Reviewed: none Reviewed Chart Review for last notes.     Objective:   Filed Vitals:   05/19/12 1613  BP: 116/60  Temp: 99.1 F (37.3 C)  TempSrc: Oral  Height: 5\' 2"  (1.575 m)  Weight: 145 lb (65.772 kg)   Physical Exam: General: hispanic female, nad, pleasant Skin:  Intact, acne on face with deep scars, chest and back. Few active pustules. Breasts: breasts appear normal, no suspicious masses, no skin or nipple changes or axillary nodes  Assessment & Plan:   1. Acne: Will prescribe Benzaclin.  There is a 14% chance of acne with Implanon. Her Implanon will be expired in one year, at which time I recommend removing it and using a copper IUD to avoid hormonal cause.

## 2012-06-06 ENCOUNTER — Telehealth (INDEPENDENT_AMBULATORY_CARE_PROVIDER_SITE_OTHER): Payer: Self-pay | Admitting: Family Medicine

## 2012-06-06 DIAGNOSIS — K625 Hemorrhage of anus and rectum: Secondary | ICD-10-CM

## 2012-06-06 NOTE — Telephone Encounter (Signed)
Apparently she doesn't have an appt with Dr Madolyn Frieze, so I changed the note to say to send in the hemocult cards to our office

## 2012-06-06 NOTE — Telephone Encounter (Signed)
I returned a phone call to the number in our chart regarding the referral for colonoscopy that Dr Rivka Safer had recommended, but no one answered. Chart review documents that he had done anoscopy for her reported rectal bleeding, but I don't see a documented guaiac positive stool. She has an appointment scheduled with Dr Madolyn Frieze 06/16/12. I recommend that she take ferrous sulfate 325 mg one tablet twice daily which is available over the counter, since she has a Hgb of 10 and low normal MCV. I will send her this recommendation and hemocult cards. If rectal bleeding is documented Dr Madolyn Frieze can refer her to [facility] when they are on call for evaluation.

## 2012-06-10 ENCOUNTER — Telehealth: Payer: Self-pay | Admitting: Family Medicine

## 2012-06-10 NOTE — Telephone Encounter (Signed)
I called pt two time and nobody answer the phone, answer machine not available to let msg. Marines

## 2012-06-16 ENCOUNTER — Ambulatory Visit: Payer: Self-pay | Admitting: Family Medicine

## 2012-06-18 ENCOUNTER — Telehealth: Payer: Self-pay | Admitting: *Deleted

## 2012-06-18 NOTE — Telephone Encounter (Signed)
Patient comes to office asking about referral for colonoscopy.Marland Kitchen Consulted with Dr. Sheffield Slider and noted his note from 06/06/2012 patient needs to fill out hemoccult cards and then start ferrous sulfate 325 mg one tablet twice daily. Also needs follow up appointment with Dr. Madolyn Frieze . Hemoccult cards given and explained  .  appointment scheduled for 06/27/2012 and interpretor scheduled. Patient states she is currently taking Ferrous Sulfate.

## 2012-06-19 NOTE — Telephone Encounter (Signed)
I been calling pt many times and no answer machine available.  Marines

## 2012-06-19 NOTE — Telephone Encounter (Signed)
She stopped by the office and Larita Fife gave her hemacult cards which she hopefully will send in to see if she is still bleeding. If she is, we likely will have to send her to a gastroenterologist in [place]. Thanks for trying to reach her so many times.

## 2012-06-23 LAB — HEMOCCULT GUIAC POC 1CARD (OFFICE): Card #3 Fecal Occult Blood, POC: POSITIVE

## 2012-06-23 NOTE — Addendum Note (Signed)
Addended by: [place], Vercie Pokorny on: 06/23/2012 04:51 PM   Modules accepted: Orders

## 2012-06-27 ENCOUNTER — Encounter: Payer: Self-pay | Admitting: Family Medicine

## 2012-06-27 ENCOUNTER — Ambulatory Visit (INDEPENDENT_AMBULATORY_CARE_PROVIDER_SITE_OTHER): Payer: Self-pay | Admitting: Family Medicine

## 2012-06-27 VITALS — BP 97/58 | HR 79 | Temp 98.0°F | Ht 62.0 in | Wt 144.7 lb

## 2012-06-27 DIAGNOSIS — K625 Hemorrhage of anus and rectum: Secondary | ICD-10-CM

## 2012-06-27 DIAGNOSIS — D649 Anemia, unspecified: Secondary | ICD-10-CM

## 2012-06-27 MED ORDER — POLYETHYLENE GLYCOL 3350 17 GM/SCOOP PO POWD: 17.0000 g | Freq: Two times a day (BID) | ORAL | Status: AC | PRN

## 2012-06-27 NOTE — Progress Notes (Signed)
Interpreter Wyvonnia Dusky for Dr Madolyn Frieze   Subjective:    Patient ID: Linda Taylor, female    DOB: 06-06-1991, 21 y.o.   MRN: [id number]  HPI Follow-up: blood in stool She has had blood in stool for about 5 years.  Every bowel movement is bloody.  No abdominal pain.  She has some constipation and has to strain with bowel movements.   Review of Systems She is complaining of some lightheadedness.  Past Medical History, Family History, Social History, Allergies, and Medications reviewed. No family history of bowel disease or colon cancer.    Objective:   Physical Exam GEN: NAD, well-nourished, well-appearing PSYCH: engaged, appropriate, Spanish-speaking primarily, pleasant, normal affect Rectal:    Small external hemorrhoids, non-inflamed   No internal hemorrhoids (visualized with anoscope) and no fissure   Non-tender during exam    Assessment & Plan:

## 2012-06-27 NOTE — Assessment & Plan Note (Addendum)
Will refer to GI in [place]. Unsure if patient will be able to see them due to limited finances. Patient has orange card. Encouraged taking iron 3 times daily to help with lightheadedness.  For constipation, Miralax, hydration, and high-fiber foods.  Follow-up as needed or in 6 months.   UPDATE: Will check CT-abdomen to rule-out obvious GI mass because I do not anticipate her getting colonoscopy any time soon. Will check PT/PTT/INR, peripheral smear.  Will need to call and inform patient to come in for testing and imaging.

## 2012-06-27 NOTE — Patient Instructions (Addendum)
Vamos a hacer una cita con una especialista de Programmer, applications.   Toma Miralax una cucharada dos veces al dia si tiene estrenimiento.  No se siente mucho tiempo en escusado (no mas de 5 minutos).  Toma mucho agua (4-8 vaso al dia).  Come mas comidas con fibra (legumbres, verduras, granos).   Regrese a Event organiser cuando puede por su papinocolao.   Mucho gusto.

## 2012-06-30 ENCOUNTER — Ambulatory Visit: Payer: Self-pay

## 2012-06-30 NOTE — Addendum Note (Signed)
Addended by: Madolyn Frieze, Marylene Land J on: 06/30/2012 10:49 AM   Modules accepted: Orders

## 2012-06-30 NOTE — Addendum Note (Signed)
Addended by: Madolyn Frieze, Marylene Land J on: 06/30/2012 10:51 AM   Modules accepted: Level of Service

## 2012-07-01 ENCOUNTER — Telehealth: Payer: Self-pay | Admitting: *Deleted

## 2012-07-01 NOTE — Telephone Encounter (Signed)
Message copied by Aram Beecham on Tue Jul 01, 2012 11:34 AM ------      Message from: [facility] PARK, ANGELA J      Created: Mon Jun 30, 2012 10:49 AM       1. BLUE TEAM: Will you schedule for CT abdomen for lower GI bleeding?            2. After we have time and date of CT, Marines:        1. Will you call and notify patient of this? And let her know that I want to take a picture of her belly because she is bleeding and it may be a while before she can get a colonoscopy.        2. Will you ask the patient to come in for labs? I want to see if she has a bleeding disorder that would make her more likely to bleed than normal people.         3. Will you ask her if she has heavy periods? If yes, how many pads/tampons a day and how long does her period last?            Thank you Marines.

## 2012-07-01 NOTE — Telephone Encounter (Signed)
CT scheduled for 07/07/12 at 12:45 at [facility] radiology, patient needs to come the day before to pick up contrast. Marines will you call and let patient know this as well as message from MD below. Thanks!

## 2012-07-02 NOTE — Telephone Encounter (Signed)
I called pt and phone is not available to lvm. I will try later.  Marines

## 2012-07-04 NOTE — Telephone Encounter (Signed)
Marines repeatedly unable to reach patient, CT rescheduled for 7/12 at 1pm. Marines could you please type up a letter for patient with her appointment info for [place] to mail. Thanks!

## 2012-07-07 ENCOUNTER — Other Ambulatory Visit (HOSPITAL_COMMUNITY): Payer: Self-pay

## 2012-07-07 NOTE — Telephone Encounter (Signed)
Pt is aware about her appt for Ct at [facility] on 7/12@12 :45  Marines

## 2012-07-11 ENCOUNTER — Other Ambulatory Visit (HOSPITAL_COMMUNITY): Payer: Self-pay

## 2012-08-03 IMAGING — US US TRANSVAGINAL NON-OB
1 series · 13 of 25 positions shown · non-contrast
Comparison: Prior ultrasound of pregnancy performed 06/12/2007

CLINICAL DATA: Generalized abdominal pain and diarrhea; assess for
tubo-ovarian abscess.

TRANSABDOMINAL AND TRANSVAGINAL ULTRASOUND OF PELVIS
TECHNIQUE: Both transabdominal and transvaginal ultrasound
examinations of the pelvis were performed. Transabdominal technique
was performed for global imaging of the pelvis including uterus,
ovaries, adnexal regions, and pelvic cul-de-sac.

[Series 1: us transvaginal non-ob · 0.20mm/px · 13 of 64 slices shown]
[im 1/64]
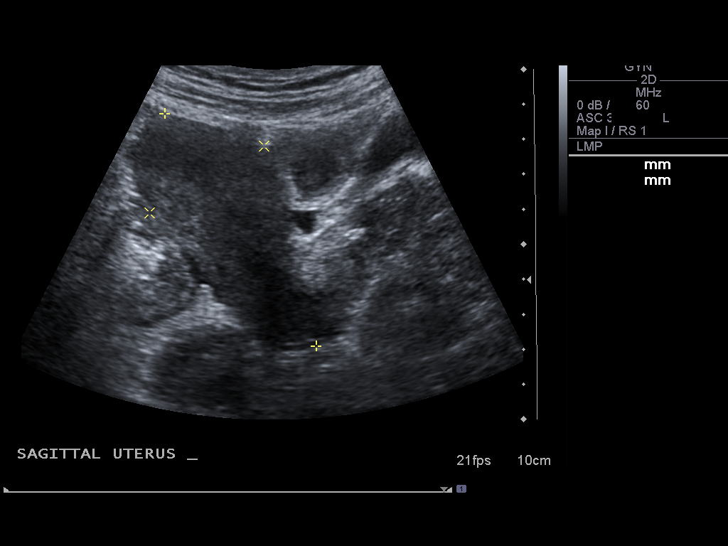
[im 6/64]
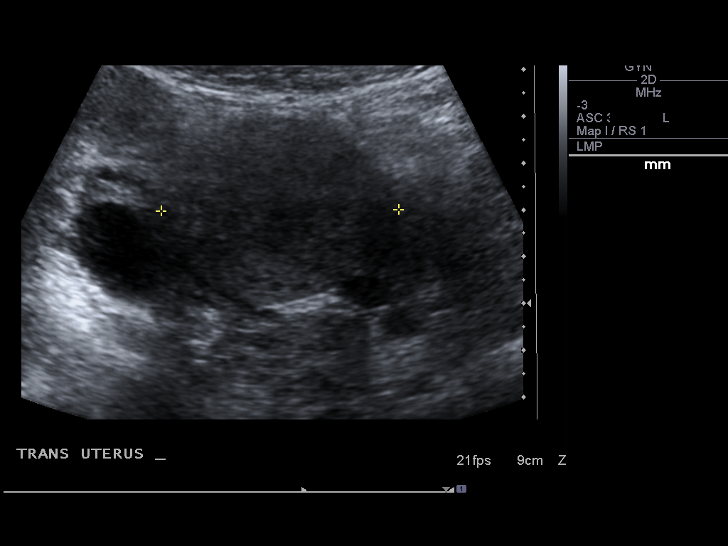
[im 11/64]
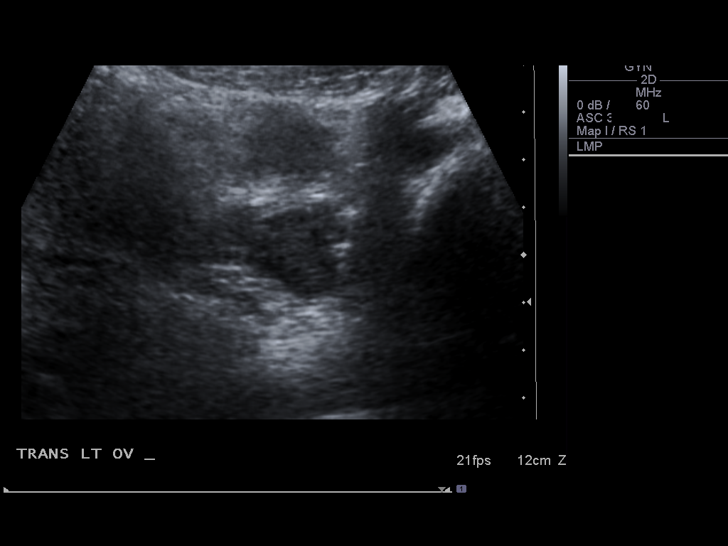
[im 16/64]
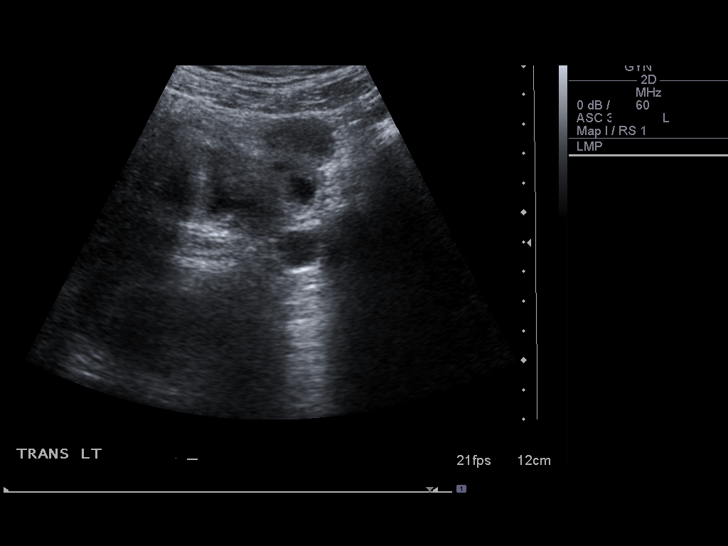
[im 22/64]
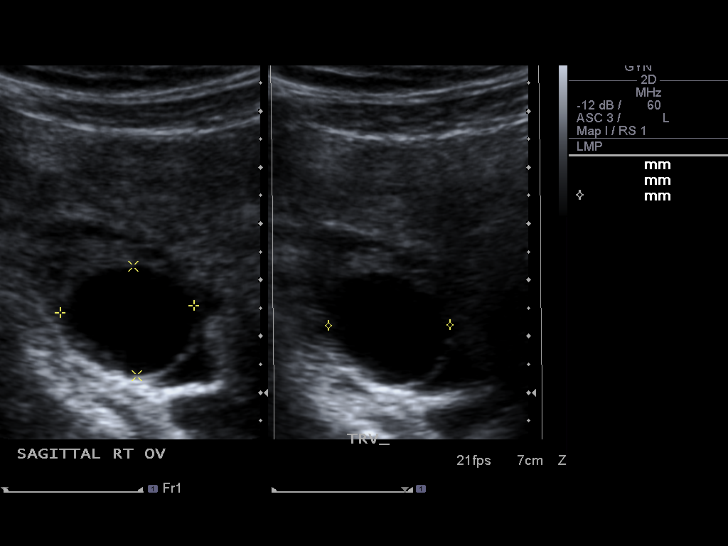
[im 27/64]
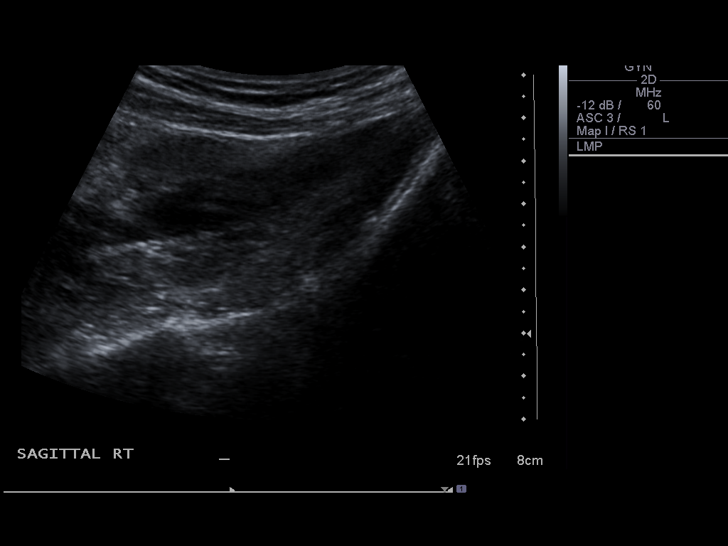
[im 32/64]
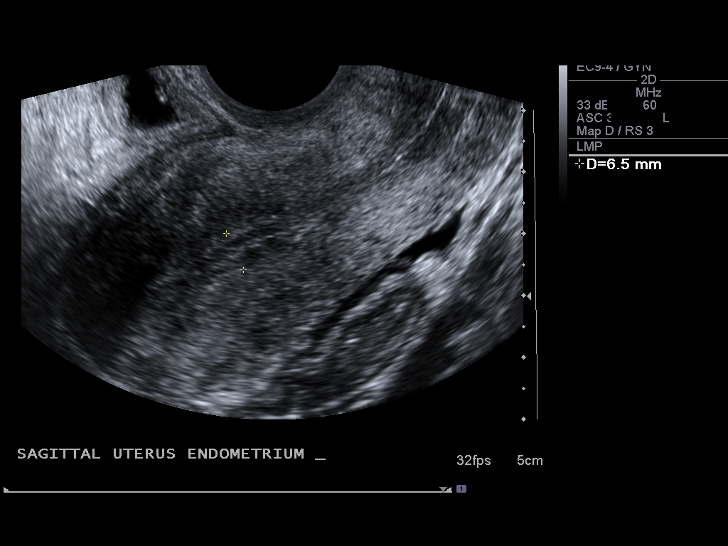
[im 37/64]
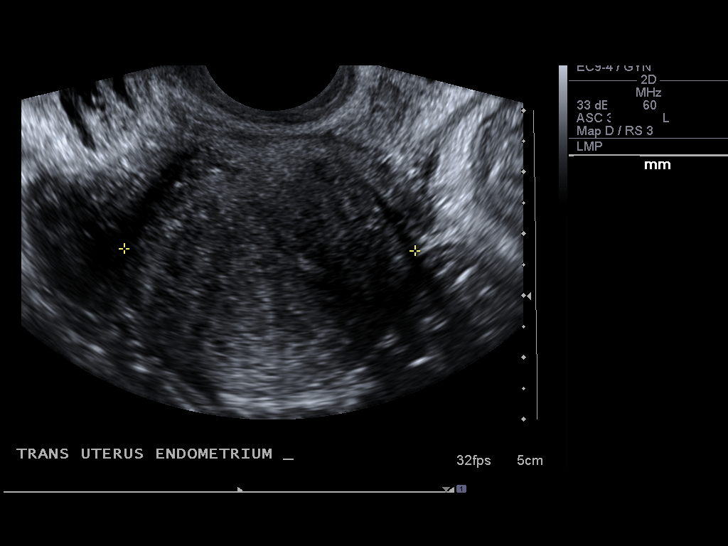
[im 43/64]
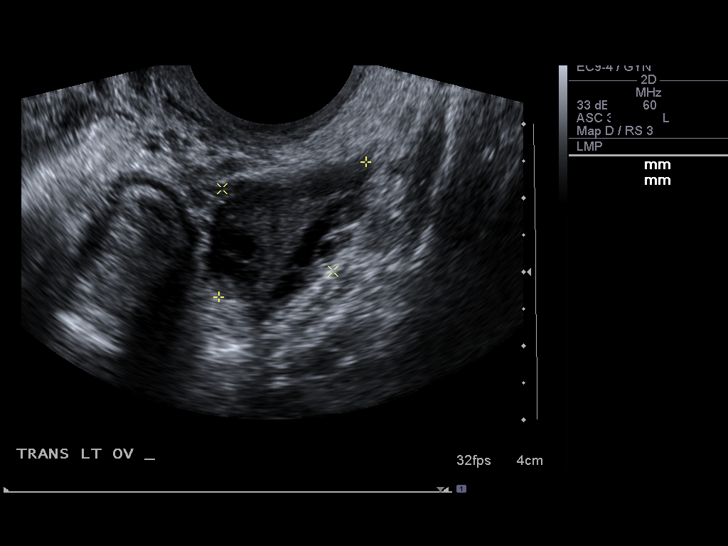
[im 48/64]
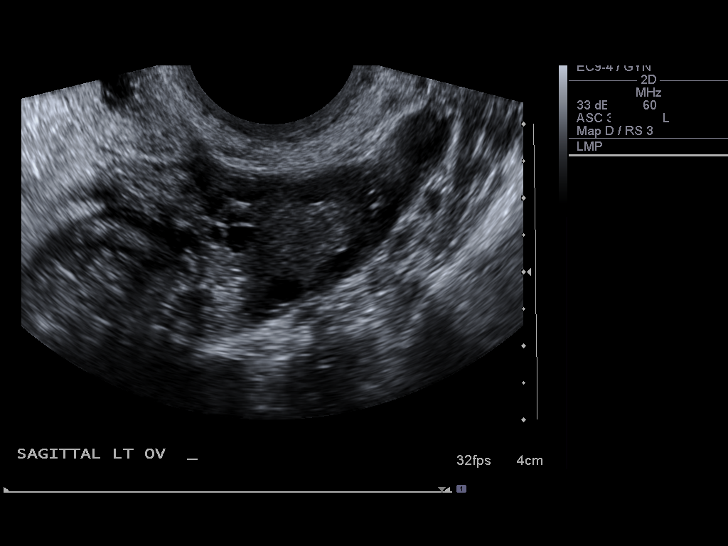
[im 53/64]
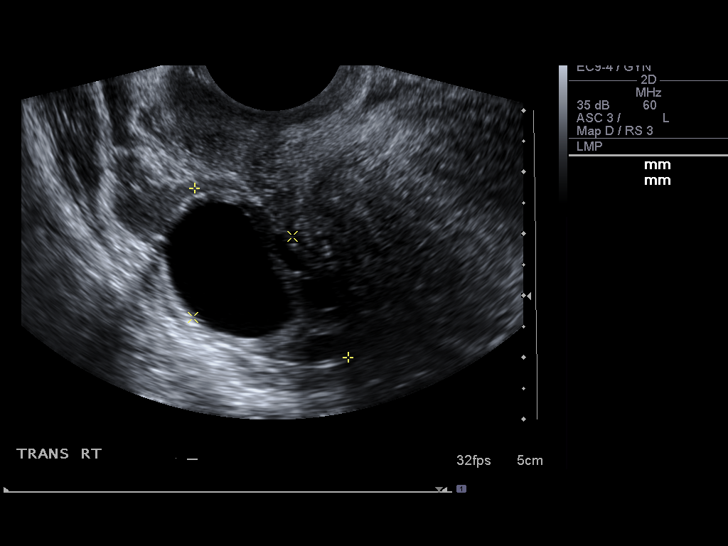
[im 58/64]
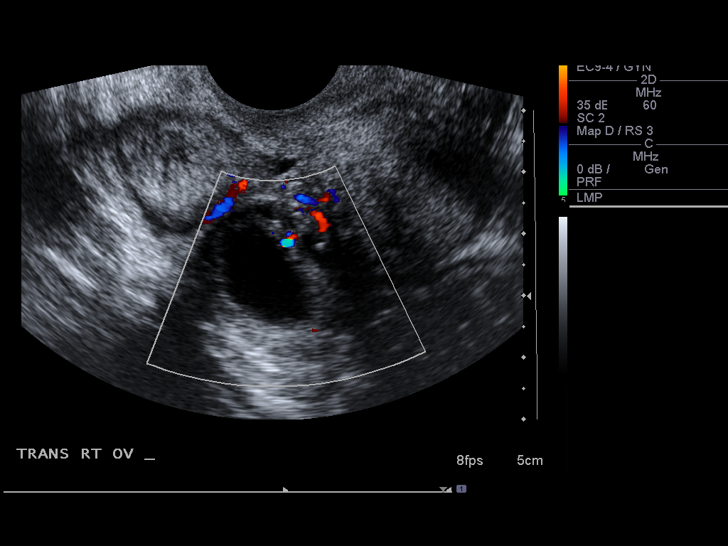
[im 64/64]
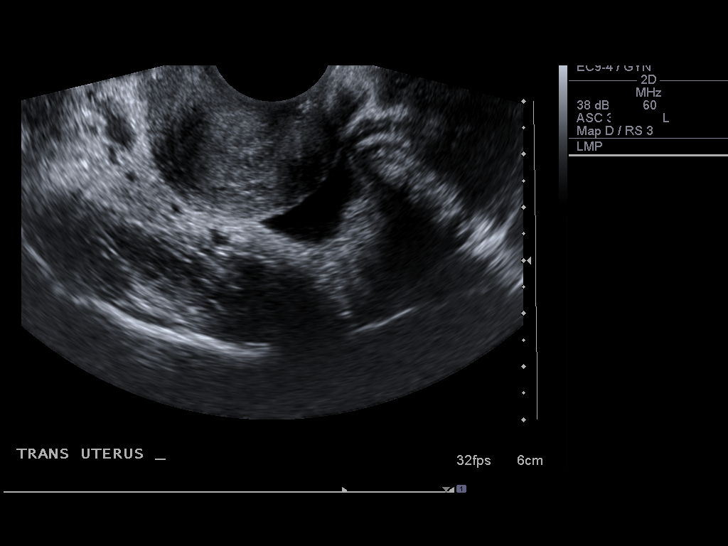

[13 of 25 positions shown; findings below may reference images not displayed]

It was necessary to proceed with endovaginal exam following the
transabdominal exam to visualize the uterus and ovaries.
FINDINGS: Uterus: Normal in size and appearance; measures 8.4 cm in length,
3.6 cm in AP dimension and 4.7 cm in transverse dimension.

Endometrium: Mildly heterogeneous in appearance, without
significant thickening or focal mass; measures 0.7 cm in thickness.

Right ovary:  Normal appearance/no adnexal mass; measures 3.7 x
x 2.4 cm.

Left ovary: Normal appearance/no adnexal mass; measures 3.4 x 2.1 x
1.9 cm.

Other findings: A small amount of free fluid is noted within the
pelvic cul-de-sac and at both adnexa.

There is no evidence of tubo-ovarian abscess.  The fallopian tubes
are not well characterized on this study.
IMPRESSION: Mildly heterogeneous appearance to the endometrial echo complex,
likely within normal limits; otherwise unremarkable pelvic
ultrasound.  Small amount of free fluid within the pelvic cul-de-
sac and at both adnexa, likely physiologic in nature.  No evidence
for tubo-ovarian abscess.

## 2013-05-11 ENCOUNTER — Encounter: Payer: Self-pay | Admitting: Internal Medicine

## 2013-05-12 ENCOUNTER — Ambulatory Visit (INDEPENDENT_AMBULATORY_CARE_PROVIDER_SITE_OTHER): Payer: Self-pay | Admitting: Internal Medicine

## 2013-05-12 ENCOUNTER — Encounter: Payer: Self-pay | Admitting: Internal Medicine

## 2013-05-12 VITALS — BP 102/60 | HR 80 | Ht 62.0 in | Wt 158.6 lb

## 2013-05-12 DIAGNOSIS — R109 Unspecified abdominal pain: Secondary | ICD-10-CM

## 2013-05-12 DIAGNOSIS — K625 Hemorrhage of anus and rectum: Secondary | ICD-10-CM

## 2013-05-12 DIAGNOSIS — K921 Melena: Secondary | ICD-10-CM

## 2013-05-12 NOTE — Progress Notes (Signed)
Patient ID: Linda Taylor, female   DOB: Sep 02, 1991, 22 y.o.   MRN: [id number] HPI: Mrs. Winterhalter is a 22 year old female with little significant past medical history he was seen in consultation at the request of Dr. Lerry Liner for evaluation of ongoing hematochezia and lower abdominal pain. Patient is accompanied today by her mother. The patient reports on and off trouble with rectal bleeding over the last 6 years. She has been told in the past she has hemorrhoids, but she feels it is more than just hemorrhoids. She reports rectal bleeding with nearly every bowel movement. She is having bowel movements approximately 3 times per day. She denies constipation at this point, though she has had constipation in the past. She is not taking any laxatives. Occasionally her stools are loose. She does report that the blood is at times bright red, at times dark red with clots. She occasionally sees mucus in her stools. She reports significant lower abdominal cramping which is worse before a bowel movement, and also can be worse with significant stress. She also reports fecal urgency. At times she has tenesmus. She denies upper abdominal pain including no epigastric pain, nausea or vomiting. Occasionally she does have heartburn, but this is rare. No known family history of IBD, but her mother may have IBS. She has never had any GI endoscopic procedure  Patient Active Problem List   Diagnosis Date Noted  . Rectal bleeding 05/12/2012  . Acne 05/12/2012  . Breast abscess 05/12/2012    Past Surgical History  Procedure Laterality Date  . No past surgeries      Current Outpatient Prescriptions  Medication Sig Dispense Refill  . Etonogestrel (IMPLANON) 68 MG IMPL Inject 1 each into the skin once. Pt states that she had this implanted on August 3rd.2011       . ferrous sulfate 325 (65 FE) MG EC tablet Take 325 mg by mouth 2 (two) times daily with a meal.      . Multiple Vitamin (MULTIVITAMIN)  tablet Take 1 tablet by mouth daily.       No current facility-administered medications for this visit.    No Known Allergies  Family History  Problem Relation Age of Onset  . Osteoarthritis Maternal Grandfather   . Diabetes Paternal Grandmother     History  Substance Use Topics  . Smoking status: Never Smoker   . Smokeless tobacco: Never Used  . Alcohol Use: No    ROS: As per history of present illness, otherwise negative  BP 102/60  Pulse 80  Ht 5\' 2"  (1.575 m)  Wt 158 lb 9.6 oz (71.94 kg)  BMI 29 kg/m2  LMP 05/07/2013 Constitutional: Well-developed and well-nourished. No distress. HEENT: Normocephalic and atraumatic. Oropharynx is clear and moist. No oropharyngeal exudate. Conjunctivae are normal.  No scleral icterus. Neck: Neck supple. Trachea midline. Cardiovascular: Normal rate, regular rhythm and intact distal pulses. No M/R/G Pulmonary/chest: Effort normal and breath sounds normal. No wheezing, rales or rhonchi. Abdominal: Soft, lower abdominal tenderness without rebound or guarding, nondistended. Bowel sounds active throughout. There are no masses palpable. No hepatosplenomegaly. Extremities: no clubbing, cyanosis, or edema Lymphadenopathy: No cervical adenopathy noted. Neurological: Alert and oriented to person place and time. Skin: Skin is warm and dry. Facial and anterior chest ance Psychiatric: Normal mood and affect. Behavior is normal.  RELEVANT LABS AND IMAGING: CBC    Component Value Date/Time   WBC 10.2 09/04/2011 1010   RBC 3.96 09/04/2011 1010   HGB 10.2* 05/19/2012 1651  HGB 10.1* 09/04/2011 1010   HCT 31.5* 09/04/2011 1010   PLT 313 09/04/2011 1010   MCV 79.5 09/04/2011 1010   MCH 25.5* 09/04/2011 1010   MCHC 32.1 09/04/2011 1010   RDW 15.0 09/04/2011 1010   LYMPHSABS 1.4 09/04/2011 1010   MONOABS 1.0 09/04/2011 1010   EOSABS 0.5 09/04/2011 1010   BASOSABS 0.0 09/04/2011 1010    CMP     Component Value Date/Time   NA 140 09/04/2011 1000   K 3.8 09/04/2011 1000    CL 108 09/04/2011 1000   CO2 28 09/04/2011 1000   GLUCOSE 88 09/04/2011 1000   BUN 12 09/04/2011 1000   CREATININE 0.59 09/04/2011 1000   CALCIUM 8.6 09/04/2011 1000   PROT 6.6 09/04/2011 1000   ALBUMIN 3.2* 09/04/2011 1000   AST 14 09/04/2011 1000   ALT 8 09/04/2011 1000   ALKPHOS 92 09/04/2011 1000   BILITOT 0.1* 09/04/2011 1000   GFRNONAA >60 09/04/2011 1000   GFRAA >60 09/04/2011 1000   More recent labs dated 05/06/2013 -- WBC 7.8, hemoglobin 14.0, hematocrit 41.5, MCV 90.2, platelet count 284 CMP within normal limits TSH normal  ASSESSMENT/PLAN:  22 year old female with little significant past medical history he was seen in consultation at the request of Dr. Lerry Liner for evaluation of ongoing hematochezia and lower abdominal pain.   1.  Hematochezia with lower abdominal pain -- given the persistence of her rectal bleeding and lower abdominal pain I have recommended colonoscopy for direct visualization. At this point I feel that we need to rule out inflammatory bowel disease as a possible etiology for her symptoms. She does not have medical insurance, and we have given her the phone number to apply for medical assistance through [facility].  Further recommendations after colonoscopy  2.  History of anemia -- on review of previous blood counts, she had anemia, but this appears to have resolved on recent labs. This is reassuring. She has continued on iron daily. #1 above could help explain previous anemia

## 2013-05-12 NOTE — Patient Instructions (Addendum)
You have been scheduled for a colonoscopy with propofol. Please follow written instructions given to you at your visit today.  Please pick up your prep kit at the pharmacy within the next 1-3 days. If you use inhalers (even only as needed), please bring them with you on the day of your procedure. Your physician has requested that you go to [email or web address] and enter the access code given to you at your visit today. This web site gives a general overview about your procedure. However, you should still follow specific instructions given to you by our office regarding your preparation for the procedure.  Please call [phone] to see if you qualify for financial assistance                                                   We are excited to introduce MyChart, a new best-in-class service that provides you online access to important information in your electronic medical record. We want to make it easier for you to view your health information - all in one secure location - when and where you need it. We expect MyChart will enhance the quality of care and service we provide.  When you register for MyChart, you can:    View your test results.    Request appointments and receive appointment reminders via email.    Request medication renewals.    View your medical history, allergies, medications and immunizations.    Communicate with your physician's office through a password-protected site.    Conveniently print information such as your medication lists.  To find out if MyChart is right for you, please talk to a member of our clinical staff today. We will gladly answer your questions about this free health and wellness tool.  If you are age 4 or older and want a member of your family to have access to your record, you must provide written consent by completing a proxy form available at our office. Please speak to our clinical staff about guidelines regarding accounts for patients younger  than age 55.  As you activate your MyChart account and need any technical assistance, please call the MyChart technical support line at [phone]) or email your question to [email or web address]. If you email your question(s), please include your name, a return phone number and the best time to reach you.  If you have non-urgent health-related questions, you can send a message to our office through MyChart at [email or web address]. If you have a medical emergency, call 911.  Thank you for using MyChart as your new health and wellness resource!   MyChart licensed from [employer],  [id number]. Patents Pending.

## 2013-05-21 ENCOUNTER — Telehealth: Payer: Self-pay | Admitting: Internal Medicine

## 2013-05-21 MED ORDER — PEG-KCL-NACL-NASULF-NA ASC-C 100 G PO SOLR: 1.0000 | Freq: Once | ORAL | Status: DC

## 2013-05-21 NOTE — Telephone Encounter (Signed)
Sent moviprep to pt's pharmacy

## 2013-05-22 ENCOUNTER — Ambulatory Visit (AMBULATORY_SURGERY_CENTER): Payer: Self-pay | Admitting: Internal Medicine

## 2013-05-22 ENCOUNTER — Other Ambulatory Visit: Payer: Self-pay | Admitting: Gastroenterology

## 2013-05-22 ENCOUNTER — Encounter: Payer: Self-pay | Admitting: Internal Medicine

## 2013-05-22 VITALS — BP 134/71 | HR 64 | Temp 98.3°F | Resp 15 | Ht 62.0 in | Wt 158.0 lb

## 2013-05-22 DIAGNOSIS — K5120 Ulcerative (chronic) proctitis without complications: Secondary | ICD-10-CM

## 2013-05-22 DIAGNOSIS — K529 Noninfective gastroenteritis and colitis, unspecified: Secondary | ICD-10-CM

## 2013-05-22 DIAGNOSIS — R109 Unspecified abdominal pain: Secondary | ICD-10-CM

## 2013-05-22 DIAGNOSIS — K5289 Other specified noninfective gastroenteritis and colitis: Secondary | ICD-10-CM

## 2013-05-22 DIAGNOSIS — K6289 Other specified diseases of anus and rectum: Secondary | ICD-10-CM

## 2013-05-22 DIAGNOSIS — K625 Hemorrhage of anus and rectum: Secondary | ICD-10-CM

## 2013-05-22 HISTORY — DX: Noninfective gastroenteritis and colitis, unspecified: K52.9

## 2013-05-22 HISTORY — DX: Ulcerative (chronic) proctitis without complications: K51.20

## 2013-05-22 MED ORDER — MESALAMINE 1000 MG RE SUPP: 1000.0000 mg | Freq: Every day | RECTAL | Status: DC

## 2013-05-22 MED ORDER — SODIUM CHLORIDE 0.9 % IV SOLN: 500.0000 mL | INTRAVENOUS | Status: DC

## 2013-05-22 NOTE — Patient Instructions (Addendum)

## 2013-05-22 NOTE — Progress Notes (Signed)
Patient did not experience any of the following events: a burn prior to discharge; a fall within the facility; wrong site/side/patient/procedure/implant event; or a hospital transfer or hospital admission upon discharge from the facility. (G8907) Patient did not have preoperative order for IV antibiotic SSI prophylaxis. (G8918)  

## 2013-05-22 NOTE — Progress Notes (Signed)
Ursula Beath up from third floor to help with translation for Dr. Rhea Belton and nurse with pt. And her mother.  Pt stayed an extended period in recovery because she did not have a ride with Her on site.

## 2013-05-22 NOTE — Progress Notes (Signed)
Called to room to assist during endoscopic procedure.  Patient ID and intended procedure confirmed with present staff. Received instructions for my participation in the procedure from the performing physician.  

## 2013-05-22 NOTE — Op Note (Signed)
[facility] [address] [place], [address]   COLONOSCOPY PROCEDURE REPORT  PATIENT: Linda, Taylor  MR#: [id number] BIRTHDATE: 1991/07/14 , 21  yrs. old GENDER: Female ENDOSCOPIST: Beverley Fiedler, MD REFERRED BY: Lerry Liner, MD PROCEDURE DATE:  05/22/2013 PROCEDURE:   Colonoscopy with biopsy ASA CLASS:   Class I INDICATIONS:Rectal Bleeding and lower abdominal pain. MEDICATIONS: MAC sedation, administered by CRNA and propofol (Diprivan) 200mg  IV  DESCRIPTION OF PROCEDURE:   After the risks benefits and alternatives of the procedure were thoroughly explained, informed consent was obtained.  A digital rectal exam revealed no rectal mass.   The LB [id number]-[id number]  endoscope was introduced through the anus and advanced to the terminal ileum which was intubated for a short distance. No adverse events experienced.   The quality of the prep was good, using MoviPrep  The instrument was then slowly withdrawn as the colon was fully examined.   COLON FINDINGS: The mucosa appeared normal in the terminal ileum. A patch of abnormal mucosa was found at the cecum.  The mucosa was erythematous and had superficial ulcers and loss of vascularity. This was likely consistent with ulcerative colitis disease. Multiple biopsies of the area were performed using cold forceps. A circumferential patch of proctitis was found in the rectum extending from the dentate line to 15 cm.  The mucosa was erythematous, friable, ulcerated and had granularity and loss of vascularity.  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 Multiple biopsies of the area were performed using cold forceps. The distal ascending colon, hepatic flexure, transverse colon, splenic flexure, descending colon, and sigmoid colon mucosa appeared normal  No polyps or cancers were seen.  Retroflexed views revealed proctitis as described above. The time to cecum=2 minutes 30 seconds.  Withdrawal time=11  minutes 43 seconds.  The scope was withdrawn and the procedure completed. COMPLICATIONS: There were no complications.  ENDOSCOPIC IMPRESSION: 1.   Normal mucosa in the terminal ileum 2.  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   Colonic mucosa from ascending colon to sigmoid normal in appearance  RECOMMENDATIONS: 1.  Await pathology results 2.  Begin Canasa suppository 1000 mg at bedtime 3.  Office follow-up in about 1 month  eSigned:  Beverley Fiedler, MD 05/22/2013 10:48 AM cc: The Patient

## 2013-05-26 ENCOUNTER — Telehealth: Payer: Self-pay | Admitting: *Deleted

## 2013-05-26 NOTE — Telephone Encounter (Signed)
Call back number is invalid, follow-up

## 2013-05-28 ENCOUNTER — Encounter: Payer: Self-pay | Admitting: Internal Medicine

## 2013-06-10 ENCOUNTER — Telehealth: Payer: Self-pay | Admitting: Internal Medicine

## 2013-06-10 NOTE — Telephone Encounter (Signed)
Pt instructed to call us back in a few weeks when she needs more samples. I will talk to the rep to see if the pharma[employer] Co can help out.

## 2013-06-10 NOTE — Telephone Encounter (Signed)
Spoke to pt told her I will leave her some samples at our front desk. Pt does not have insurance and cannot afford the medication.

## 2013-06-22 ENCOUNTER — Encounter: Payer: Self-pay | Admitting: Internal Medicine

## 2013-06-23 ENCOUNTER — Ambulatory Visit (INDEPENDENT_AMBULATORY_CARE_PROVIDER_SITE_OTHER): Payer: Self-pay | Admitting: Internal Medicine

## 2013-06-23 ENCOUNTER — Encounter: Payer: Self-pay | Admitting: Internal Medicine

## 2013-06-23 VITALS — BP 100/62 | HR 72 | Ht 62.21 in | Wt 153.5 lb

## 2013-06-23 DIAGNOSIS — K5120 Ulcerative (chronic) proctitis without complications: Secondary | ICD-10-CM

## 2013-06-23 DIAGNOSIS — K5900 Constipation, unspecified: Secondary | ICD-10-CM

## 2013-06-23 DIAGNOSIS — Z23 Encounter for immunization: Secondary | ICD-10-CM

## 2013-06-23 NOTE — Progress Notes (Signed)
Subjective:    Patient ID: Linda Linda Taylor, female    DOB: 11-11-1991, 22 y.o.   MRN: [id number]  HPI Linda Linda Taylor is a 22 year old female with recent diagnosis of ulcerative proctitis who is seen in followup. She is here alone today. 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 The patient came for colonoscopy to evaluate rectal bleeding and lower abdominal pain on 05/22/2013. It revealed normal terminal ileum, also proctitis with patchy mild colitis of the cecal base. Colonic mucosa was normal from the ascending colon to the sigmoid colon. Biopsies revealed mildly active chronic colitis at the cecal base and severely active chronic proctitis in the rectum. No dysplasia or malignancy. Patient was started on Canasa 1000 mg at bedtime. She has been getting samples from our office because she's been unable to afford this medication.  She has had dramatic improvement with this medication reports no further loose stools or rectal bleeding. No further tenesmus.  In fact she has felt mildly constipated. She also feels that her appetite is slightly decreased and she has lost an estimated 4 pounds. No nausea or vomiting. No abdominal pain. No fevers or chills. No mouth ulcers. No rashes. Some mild stinging and burning in her eyes but no double vision or eye pain.  Review of Systems As per history of present illness, otherwise negative  Current Medications, Allergies, Past Medical History, Past Surgical History, Family History and Social History were reviewed in [employer] record.     Objective:   Physical Exam BP 100/62  Pulse 72  Ht 5' 2.21" (1.58 m)  Wt 153 lb 8 oz (69.627 kg)  BMI 27.89 kg/m2  LMP 05/27/2013 Constitutional: Well-developed and well-nourished. No distress. HEENT: Normocephalic and atraumatic. Oropharynx is Linda Taylor and moist. No oropharyngeal exudate. Conjunctivae are normal.  No scleral icterus. Cardiovascular: Normal rate, regular  rhythm and intact distal pulses. No M/R/G Pulmonary/chest: Effort normal and breath sounds normal. No wheezing, rales or rhonchi. Abdominal: Soft, nontender, nondistended. Bowel sounds active throughout. There are no masses palpable. No hepatosplenomegaly. Extremities: no clubbing, cyanosis, or edema Neurological: Alert and oriented to person place and time. Skin: Skin is warm and dry. No rashes noted. Psychiatric: Normal mood and affect. Behavior is normal.     Assessment & Plan:  22 year old female with recent diagnosis of ulcerative proctitis who is seen in followup  1.  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 We have discussed that this is a chronic condition that can relapse and remit. I would like her to continue Canasa suppositories 1000 mg at bedtime for 8 weeks. She can continue to get samples from our office if necessary to help her financially. We will attempt to stop this medication after 8 weeks. If her rectal bleeding, loose stools, or tenesmus returns she may need this medication long-term. It is reassuring to this point but she has improved without the need for steroids. We also discussed future flexible sigmoidoscopy to document endoscopic healing. I let to see her back in 3 months time to reassess her progress. If symptoms return after completing 8 weeks of Canasa, she is asked to call our office immediately. She voices understanding. I would also like for her to start MiraLax 17 g daily to help her with mild constipation. Her stools are too loose she can use this every other day. We discussed vaccination given her IBD, and I recommended Pneumovax which she will seek some  help department an annual flu vaccine. Bone mineral density is not felt necessary given her lack of steroid exposure to this point.  2.  Constipation -- see #1

## 2013-06-23 NOTE — Patient Instructions (Addendum)
Continue taking canasa daily, start taking miralax daily   Call us if your symptoms worsen.   Follow up with Dr. Rhea Belton in clinic in 3 months.                                               We are excited to introduce MyChart, a new best-in-class service that provides you online access to important information in your electronic medical record. We want to make it easier for you to view your health information - all in one secure location - when and where you need it. We expect MyChart will enhance the quality of care and service we provide.  When you register for MyChart, you can:    View your test results.    Request appointments and receive appointment reminders via email.    Request medication renewals.    View your medical history, allergies, medications and immunizations.    Communicate with your physician's office through a password-protected site.    Conveniently print information such as your medication lists.  To find out if MyChart is right for you, please talk to a member of our clinical staff today. We will gladly answer your questions about this free health and wellness tool.  If you are age 42 or older and want a member of your family to have access to your record, you must provide written consent by completing a proxy form available at our office. Please speak to our clinical staff about guidelines regarding accounts for patients younger than age 27.  As you activate your MyChart account and need any technical assistance, please call the MyChart technical support line at [phone]-CHART [phone]) or email your question to [email or web address]. If you email your question(s), please include your name, a return phone number and the best time to reach you.  If you have non-urgent health-related questions, you can send a message to our office through MyChart at [email or web address]. If you have a medical emergency, call 911.  Thank you for using MyChart as your new  health and wellness resource!   MyChart licensed from [employer],  4782-9562. Patents Pending.

## 2013-07-13 ENCOUNTER — Other Ambulatory Visit: Payer: Self-pay | Admitting: *Deleted

## 2013-07-13 DIAGNOSIS — N644 Mastodynia: Secondary | ICD-10-CM

## 2013-07-14 ENCOUNTER — Ambulatory Visit (HOSPITAL_COMMUNITY)
Admission: RE | Admit: 2013-07-14 | Discharge: 2013-07-14 | Disposition: A | Discharge status: Home / Self Care | Payer: Self-pay | Source: Ambulatory Visit | Attending: Obstetrics and Gynecology | Admitting: Obstetrics and Gynecology

## 2013-07-14 ENCOUNTER — Encounter (HOSPITAL_COMMUNITY): Payer: Self-pay

## 2013-07-14 VITALS — BP 102/60 | Temp 98.6°F | Ht 62.5 in | Wt 152.2 lb

## 2013-07-14 DIAGNOSIS — N644 Mastodynia: Secondary | ICD-10-CM | POA: Insufficient documentation

## 2013-07-14 DIAGNOSIS — Z1239 Encounter for other screening for malignant neoplasm of breast: Secondary | ICD-10-CM

## 2013-07-14 NOTE — Patient Instructions (Signed)
Taught Linda Taylor how to perform BSE and gave educational materials to take home. Patient did not need a Pap smear today due to last Pap smear was June 2014 per patient. Let her know BCCCP will cover Pap smears every 3 years unless has a history of abnormal Pap smears. Referred patient to the [facility] for bilateral breast ultrasound. Appointment scheduled for Thursday, June 16, 2013 at 1340. Patient aware of appointment and will be there.  Linda Taylor verbalized understanding.  [place], Kathaleen Maser, RN 9:06 AM

## 2013-07-14 NOTE — Progress Notes (Signed)
Patient complained of bilateral outer breast pain and very sensitive nipples x 6 years that comes and goes. Patient rates pain at a 2 out of 10.  Pap Smear:    Pap smear not completed today. Last Pap smear was June 2014 at the [facility] and normal per patient. Per patient has no history of an abnormal Pap smear. No Pap smear results in EPIC.  Physical exam: Breasts Breasts symmetrical. No skin abnormalities bilateral breasts. No nipple retraction bilateral breasts. No nipple discharge bilateral breasts. No lymphadenopathy. No lumps palpated bilateral breasts. Complaints of bilateral outer breast tenderness on exam. Referred patient to the [facility] for bilateral breast ultrasound. Appointment scheduled for Thursday, June 16, 2013 at 1340     Pelvic/Bimanual No Pap smear completed today since last Pap smear was June 2014. Pap smear not indicated per BCCCP guidelines.

## 2013-07-16 ENCOUNTER — Other Ambulatory Visit: Payer: Self-pay

## 2013-07-16 ENCOUNTER — Ambulatory Visit
Admission: RE | Admit: 2013-07-16 | Discharge: 2013-07-16 | Disposition: A | Discharge status: Home / Self Care | Payer: No Typology Code available for payment source | Source: Ambulatory Visit | Attending: Obstetrics and Gynecology | Admitting: Obstetrics and Gynecology

## 2013-07-16 DIAGNOSIS — N644 Mastodynia: Secondary | ICD-10-CM

## 2013-07-21 ENCOUNTER — Telehealth: Payer: Self-pay | Admitting: Internal Medicine

## 2013-07-21 NOTE — Telephone Encounter (Signed)
We are completely out of canasa. Will call pt when we get some

## 2013-09-18 ENCOUNTER — Encounter: Payer: Self-pay | Admitting: Internal Medicine

## 2013-09-22 ENCOUNTER — Ambulatory Visit (INDEPENDENT_AMBULATORY_CARE_PROVIDER_SITE_OTHER): Payer: Self-pay | Admitting: Internal Medicine

## 2013-09-22 ENCOUNTER — Other Ambulatory Visit (INDEPENDENT_AMBULATORY_CARE_PROVIDER_SITE_OTHER): Payer: Self-pay

## 2013-09-22 ENCOUNTER — Encounter: Payer: Self-pay | Admitting: Internal Medicine

## 2013-09-22 VITALS — BP 114/60 | HR 72 | Ht 62.5 in | Wt 152.8 lb

## 2013-09-22 DIAGNOSIS — Z7189 Other specified counseling: Secondary | ICD-10-CM

## 2013-09-22 DIAGNOSIS — K5120 Ulcerative (chronic) proctitis without complications: Secondary | ICD-10-CM

## 2013-09-22 LAB — BASIC METABOLIC PANEL
Calcium: 9 mg/dL (ref 8.4–10.5)
Creatinine, Ser: 0.6 mg/dL (ref 0.4–1.2)
GFR: 125.67 mL/min (ref >60.00)
Glucose, Bld: 98 mg/dL (ref 70–99)
Sodium: 141 mEq/L (ref 135–145)

## 2013-09-22 LAB — CBC
Hemoglobin: 13.3 g/dL (ref 12.0–15.0)
MCHC: 34.2 g/dL (ref 30.0–36.0)
MCV: 89.3 fl (ref 78.0–100.0)
Platelets: 236 10*3/uL (ref 150.0–400.0)

## 2013-09-22 LAB — SEDIMENTATION RATE: Sed Rate: 10 mm/hr (ref 0–22)

## 2013-09-22 NOTE — Progress Notes (Signed)
Subjective:    Patient ID: Linda Taylor, female    DOB: 1991-05-13, 22 y.o.   MRN: [id number]  HPI Linda Taylor is a 22 year old female with fairly recent diagnosis of ulcerative proctitis who is seen in followup. She is here alone today, though a medical Spanish interpreter is present in the room. The patient had a colonoscopy in May 2014 which revealed proctitis with a patch of colitis at the cecal base.she was started on Canasa suppositories 1 g each bedtime and had improvement in her symptoms. She did not require steroids. She completed 8-10 weeks of Canasa therapy and the medication was weaned off. Since coming off Canasa she has had return of her proctitis symptoms including loose and bloody stools occurring 2-3 times per day. Her tenesmus has returned and she also reports lower GI cramping. She also reports gas and bloating. No fevers or chills. No rashes. No mouth ulcers. No joint pains. No melena. She stopped oral iron for unclear reasons. Appetite has remained okay without significant weight loss.  Review of Systems As per history of present illness, otherwise negative  Current Medications, Allergies, Past Medical History, Past Surgical History, Family History and Social History were reviewed in [employer] record.     Objective:   Physical Exam BP 114/60  Pulse 72  Ht 5' 2.5" (1.588 m)  Wt 152 lb 12.8 oz (69.31 kg)  BMI 27.48 kg/m2  LMP 08/31/2013 Constitutional: Well-developed and well-nourished. No distress. HEENT: Normocephalic and atraumatic. Oropharynx is Taylor and moist. No oropharyngeal exudate. Conjunctivae are normal.  No scleral icterus. Neck: Neck supple. Trachea midline. Cardiovascular: Normal rate, regular rhythm and intact distal pulses. No M/R/G Pulmonary/chest: Effort normal and breath sounds normal. No wheezing, rales or rhonchi. Abdominal: Soft, very mild left-sided tenderness without rebound or guarding, nondistended.  Bowel sounds active throughout.  Extremities: no clubbing, cyanosis, or edema Lymphadenopathy: No cervical adenopathy noted. Neurological: Alert and oriented to person place and time. Skin: Skin is warm and dry. No rashes noted. Psychiatric: Normal mood and affect. Behavior is normal.  CBC    Component Value Date/Time   WBC 10.2 09/04/2011 1010   RBC 3.96 09/04/2011 1010   HGB 10.2* 05/19/2012 1651   HGB 10.1* 09/04/2011 1010   HCT 31.5* 09/04/2011 1010   PLT 313 09/04/2011 1010   MCV 79.5 09/04/2011 1010   MCH 25.5* 09/04/2011 1010   MCHC 32.1 09/04/2011 1010   RDW 15.0 09/04/2011 1010   LYMPHSABS 1.4 09/04/2011 1010   MONOABS 1.0 09/04/2011 1010   EOSABS 0.5 09/04/2011 1010   BASOSABS 0.0 09/04/2011 1010    Labs today - CBC, ESR, BMET    Assessment & Plan:  22 year old female with fairly recent diagnosis of ulcerative proctitis who is seen in followup  1.  Ulcerative proctitis, with current flare -- her proctitis has returned after stopping Canasa suppository. I recommend resuming Canasa 1 g each bedtime on a continuous basis. We have discussed that she will likely need this medication long-term without disruption therapy. Hopefully this will recap her remission without the need for oral steroids. If it fails to capture remission, prednisone will be the next choice. Unfortunately this medication is very expensive for her and she does not have medical insurance. She is originally from [place] and is unsure if she can get this medication from her home country. We will contact our drug representative for Canasa to try to obtain samples for her and see if they have a program for compassionate  use.  She did have some mild constipation symptoms when her proctitis resolved previously, and if this occurs again she can use MiraLax 17 g once daily. She can use this less frequently such as every other day or every 3 days if her stools are too loose with daily use.  I've asked her to contact me if she fails to improve with  resuming Canasa.  She voices understanding.  2.  Flu vaccine -- I recommended flu vaccine today but she was unsure that she could afford it here.  We directed her to the health Department, or [facility] who is currently administering flu vaccines for $20-$25.

## 2013-09-22 NOTE — Patient Instructions (Addendum)
Your physician has requested that you go to the basement for the following lab work before leaving today: CBC. B-MET. ESR.     Dr. Rhea Belton recommends you get a Flu shot. You can get them at [facility] or [facility] for $25.00                                               We are excited to introduce MyChart, a new best-in-class service that provides you online access to important information in your electronic medical record. We want to make it easier for you to view your health information - all in one secure location - when and where you need it. We expect MyChart will enhance the quality of care and service we provide.  When you register for MyChart, you can:    View your test results.    Request appointments and receive appointment reminders via email.    Request medication renewals.    View your medical history, allergies, medications and immunizations.    Communicate with your physician's office through a password-protected site.    Conveniently print information such as your medication lists.  To find out if MyChart is right for you, please talk to a member of our clinical staff today. We will gladly answer your questions about this free health and wellness tool.  If you are age 24 or older and want a member of your family to have access to your record, you must provide written consent by completing a proxy form available at our office. Please speak to our clinical staff about guidelines regarding accounts for patients younger than age 24.  As you activate your MyChart account and need any technical assistance, please call the MyChart technical support line at [phone] 83-CHART [phone]) or email your question to [email or web address]. If you email your question(s), please include your name, a return phone number and the best time to reach you.  If you have non-urgent health-related questions, you can send a message to our office through MyChart at [email or web address]. If you have a  medical emergency, call 911.  Thank you for using MyChart as your new health and wellness resource!   MyChart licensed from [employer],  [id number]. Patents Pending.

## 2013-09-23 ENCOUNTER — Telehealth: Payer: Self-pay | Admitting: *Deleted

## 2013-09-23 NOTE — Telephone Encounter (Signed)
Informed pt of labs via [employer]; also she was given instructions to eat 1-2 bananas daily to increase her potassium. We are still trying to obtain Canasa Suppositories for her; pt stated understanding.

## 2013-09-23 NOTE — Telephone Encounter (Signed)
Message copied by Florene Glen on Wed Sep 23, 2013  9:32 AM ------      Message from: Beverley Fiedler      Created: Tue Sep 22, 2013  9:33 PM       Blood counts have improved, no further anemia      Metabolic panel is normal except mildly low potassium. I recommend she eat 1-2 bananas daily      We are trying to get Canasa samples for the patient, who please check with Kennyth Arnold on progress in this regard ------

## 2013-09-29 ENCOUNTER — Telehealth: Payer: Self-pay | Admitting: Internal Medicine

## 2013-09-29 NOTE — Telephone Encounter (Signed)
Through pacific interpreters I called the pt. To let her know I have canasa samples for her to pick up at her convenience at our front desk.

## 2013-10-28 ENCOUNTER — Encounter (HOSPITAL_COMMUNITY): Payer: Self-pay | Admitting: Family

## 2013-10-28 ENCOUNTER — Inpatient Hospital Stay (HOSPITAL_COMMUNITY)
Admission: AD | Admit: 2013-10-28 | Discharge: 2013-10-28 | Disposition: A | Discharge status: Home / Self Care | Payer: Self-pay | Source: Ambulatory Visit | Attending: Obstetrics and Gynecology | Admitting: Obstetrics and Gynecology

## 2013-10-28 DIAGNOSIS — N938 Other specified abnormal uterine and vaginal bleeding: Secondary | ICD-10-CM | POA: Insufficient documentation

## 2013-10-28 DIAGNOSIS — N926 Irregular menstruation, unspecified: Secondary | ICD-10-CM

## 2013-10-28 DIAGNOSIS — G8929 Other chronic pain: Secondary | ICD-10-CM

## 2013-10-28 DIAGNOSIS — N949 Unspecified condition associated with female genital organs and menstrual cycle: Secondary | ICD-10-CM | POA: Insufficient documentation

## 2013-10-28 DIAGNOSIS — R1031 Right lower quadrant pain: Secondary | ICD-10-CM | POA: Insufficient documentation

## 2013-10-28 LAB — URINALYSIS, ROUTINE W REFLEX MICROSCOPIC
Bilirubin Urine: NEGATIVE
Ketones, ur: NEGATIVE mg/dL
Leukocytes, UA: NEGATIVE
Nitrite: NEGATIVE
Protein, ur: NEGATIVE mg/dL
Specific Gravity, Urine: 1.01 (ref 1.005–1.030)
Urobilinogen, UA: 0.2 mg/dL (ref 0.0–1.0)

## 2013-10-28 LAB — URINE MICROSCOPIC-ADD ON

## 2013-10-28 LAB — POCT PREGNANCY, URINE: Preg Test, Ur: NEGATIVE

## 2013-10-28 LAB — WET PREP, GENITAL: Trich, Wet Prep: NONE SEEN

## 2013-10-28 MED ORDER — KETOROLAC TROMETHAMINE 60 MG/2ML IM SOLN
60.0000 mg | Freq: Once | INTRAMUSCULAR | Status: DC
  Filled 2013-10-28: qty 2

## 2013-10-28 MED ORDER — ACETAMINOPHEN-CODEINE #3 300-30 MG PO TABS: 1.0000 | ORAL_TABLET | Freq: Four times a day (QID) | ORAL | Status: AC | PRN

## 2013-10-28 MED ORDER — PROMETHAZINE HCL 25 MG PO TABS: 25.0000 mg | ORAL_TABLET | Freq: Four times a day (QID) | ORAL | Status: DC | PRN

## 2013-10-28 NOTE — MAU Provider Note (Signed)
History     CSN: [id number]  Arrival date and time: 10/28/13 1031   First Provider Initiated Contact with Patient 10/28/13 1206      Chief Complaint  Patient presents with  . Vaginal Discharge   HPI Comments: Linda Taylor 22 y.o. [id number] presents today with complaints with vaginal discharge x 4 days.  Discharge is brown in color with red mucus.  Reports this is her second Nexplanon.  Her menstrual cycle was regular with previous implant.  With this implant, she is having two period a month currently.  Complains of right lower quadrant pain not related to ulcerative proctitis than is a 5 out of 10.  Reports new sexual partner.    Vaginal Discharge The patient's primary symptoms include a vaginal discharge. Associated symptoms include abdominal pain (RLQ).     Past Medical History  Diagnosis Date  . Hemorrhoid   . Anxiety   . Gastritis   . 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 Rhea Belton  . Chronic colitis 05/22/2013    Dr. Rhea Belton    Past Surgical History  Procedure Laterality Date  . No past surgeries      Family History  Problem Relation Age of Onset  . Osteoarthritis Maternal Grandfather   . Diabetes Paternal Grandmother     History  Substance Use Topics  . Smoking status: Never Smoker   . Smokeless tobacco: Never Used  . Alcohol Use: No    Allergies: No Known Allergies  Prescriptions prior to admission  Medication Sig Dispense Refill  . mesalamine (CANASA) 1000 MG suppository Place 1,000 mg rectally at bedtime.        Review of Systems  Constitutional: Negative.   HENT: Negative.   Respiratory: Negative.   Cardiovascular: Negative.   Gastrointestinal: Positive for abdominal pain (RLQ).  Genitourinary: Positive for vaginal discharge.  Musculoskeletal: Negative.   Skin: Negative.   Neurological: Negative.    Physical Exam   There were no vitals taken for this visit.  Physical Exam  Constitutional: She appears well-developed  and well-nourished. No distress.  HENT:  Head: Normocephalic and atraumatic.  Cardiovascular: Normal rate, regular rhythm and normal heart sounds.   Respiratory: Effort normal and breath sounds normal.  GI: Soft. Bowel sounds are normal. There is tenderness (RLQ with palpation).  Genitourinary: There is bleeding (old and new blood noted) around the vagina.   Results for orders placed during the hospital encounter of 10/28/13 (from the past 24 hour(s))  URINALYSIS, ROUTINE W REFLEX MICROSCOPIC     Status: Abnormal   Collection Time    10/28/13 10:50 AM      Result Value Range   Color, Urine YELLOW  YELLOW   APPearance CLEAR  CLEAR   Specific Gravity, Urine 1.010  1.005 - 1.030   pH 6.5  5.0 - 8.0   Glucose, UA NEGATIVE  NEGATIVE mg/dL   Hgb urine dipstick SMALL (*) NEGATIVE   Bilirubin Urine NEGATIVE  NEGATIVE   Ketones, ur NEGATIVE  NEGATIVE mg/dL   Protein, ur NEGATIVE  NEGATIVE mg/dL   Urobilinogen, UA 0.2  0.0 - 1.0 mg/dL   Nitrite NEGATIVE  NEGATIVE   Leukocytes, UA NEGATIVE  NEGATIVE  URINE MICROSCOPIC-ADD ON     Status: None   Collection Time    10/28/13 10:50 AM      Result Value Range   Squamous Epithelial / LPF RARE  RARE   WBC, UA 0-2  <3 WBC/hpf   RBC / HPF 0-2  <  3 RBC/hpf  POCT PREGNANCY, URINE     Status: None   Collection Time    10/28/13 11:05 AM      Result Value Range   Preg Test, Ur NEGATIVE  NEGATIVE  WET PREP, GENITAL     Status: Abnormal   Collection Time    10/28/13 12:20 PM      Result Value Range   Yeast Wet Prep HPF POC NONE SEEN  NONE SEEN   Trich, Wet Prep NONE SEEN  NONE SEEN   Clue Cells Wet Prep HPF POC NONE SEEN  NONE SEEN   WBC, Wet Prep HPF POC MODERATE (*) NONE SEEN   MAU Course  Procedures  MDM  Wet prep, GC, Chlamydia Pt declined Toradol due to NSAID issues  Assessment and Plan   A: Break through bleeding with Nexplanon RLQ pain ? Menses verses cyst  P: Tylenol # 3 q6 hours prn pain, phenergan 25 mg q 6 hrs PRN nausea   [facility] in [facility] Phenergan in case nausea with Tylenol #3  Sachiko Methot, Meilissa E 10/28/2013, 12:31 PM

## 2013-10-28 NOTE — MAU Note (Signed)
22 yo, G3P2, presents to MAU with c/o brown vaginal discharge x 4 days; reports sharp RLQ pain since this morning. Has Implanon x 2 months; hx of irregular cycles.  Unsure of last LMP - earlier this month.

## 2013-10-29 LAB — GC/CHLAMYDIA PROBE AMP
CT Probe RNA: NEGATIVE
GC Probe RNA: NEGATIVE

## 2013-10-30 NOTE — MAU Provider Note (Signed)
Attestation of Attending Supervision of Advanced Practitioner (CNM/NP): Evaluation and management procedures were performed by the Advanced Practitioner under my supervision and collaboration.  I have reviewed the Advanced Practitioner's note and chart, and I agree with the management and plan.  Tabathia Knoche 10/30/2013 9:30 AM   

## 2013-11-03 ENCOUNTER — Inpatient Hospital Stay (HOSPITAL_COMMUNITY)
Admission: AD | Admit: 2013-11-03 | Discharge: 2013-11-03 | Disposition: A | Discharge status: Home / Self Care | Payer: Self-pay | Source: Ambulatory Visit | Attending: Obstetrics & Gynecology | Admitting: Obstetrics & Gynecology

## 2013-11-03 ENCOUNTER — Inpatient Hospital Stay (HOSPITAL_COMMUNITY): Payer: Self-pay

## 2013-11-03 ENCOUNTER — Encounter (HOSPITAL_COMMUNITY): Payer: Self-pay | Admitting: *Deleted

## 2013-11-03 DIAGNOSIS — N938 Other specified abnormal uterine and vaginal bleeding: Secondary | ICD-10-CM | POA: Insufficient documentation

## 2013-11-03 DIAGNOSIS — R109 Unspecified abdominal pain: Secondary | ICD-10-CM | POA: Insufficient documentation

## 2013-11-03 DIAGNOSIS — N949 Unspecified condition associated with female genital organs and menstrual cycle: Secondary | ICD-10-CM

## 2013-11-03 HISTORY — DX: Anemia, unspecified: D64.9

## 2013-11-03 LAB — URINALYSIS, ROUTINE W REFLEX MICROSCOPIC
Leukocytes, UA: NEGATIVE
Nitrite: NEGATIVE
Protein, ur: NEGATIVE mg/dL
Specific Gravity, Urine: 1.01 (ref 1.005–1.030)
Urobilinogen, UA: 0.2 mg/dL (ref 0.0–1.0)
pH: 5.5 (ref 5.0–8.0)

## 2013-11-03 LAB — CBC
MCV: 87.8 fL (ref 78.0–100.0)
Platelets: 267 10*3/uL (ref 150–400)
RBC: 4.42 MIL/uL (ref 3.87–5.11)
RDW: 11.9 % (ref 11.5–15.5)
WBC: 6.5 10*3/uL (ref 4.0–10.5)

## 2013-11-03 LAB — POCT PREGNANCY, URINE: Preg Test, Ur: NEGATIVE

## 2013-11-03 LAB — URINE MICROSCOPIC-ADD ON

## 2013-11-03 MED ORDER — ACETAMINOPHEN-CODEINE #3 300-30 MG PO TABS
1.0000 | ORAL_TABLET | Freq: Once | ORAL | Status: AC
  Administered 2013-11-03: 1 via ORAL
  Filled 2013-11-03: qty 1

## 2013-11-03 MED ORDER — ESTROGENS CONJUGATED 1.25 MG PO TABS: 1.2500 mg | ORAL_TABLET | Freq: Every day | ORAL | Status: AC

## 2013-11-03 NOTE — MAU Note (Signed)
Seen in MAU 6 days ago for bleeding and cramping; wet prep and GC was neg; given Tylenol #3 rx; c/o today of cramping and bleeding- states that bleeding has been for past 15 days; UPT is negative today; states that bleeding is much lighter but still bleeding; pt is on Implanon and has been for past 3 years; states that she has anemia;

## 2013-11-03 NOTE — MAU Note (Signed)
Pt seen in MAU for bleeding & pain, still has these sx's.  Has been bleeding for 15 days.

## 2013-11-03 NOTE — MAU Provider Note (Signed)
History     CSN: [id number]  Arrival date and time: 11/03/13 4098   First Provider Initiated Contact with Patient 11/03/13 1012      Chief Complaint  Patient presents with  . Vaginal Bleeding  . Abdominal Pain   HPI  Pt is a [id number] here with report of continued bleeding and cramping that started 15 days ago.    Seen in MAU 6 days ago for similar complaint.  Wet prep and GC/CT negative.  Given RX for Tylenol #3.  Pain improves with taking medication but returns shortly after.  Bleeding has continued. UPT is negative.  Implanon x 3 years, then replaced 2 months ago.  Pt denies prior occurrence of irregular bleeding.      Past Medical History  Diagnosis Date  . Hemorrhoid   . Anxiety   . Gastritis   . 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 Rhea Belton  . Chronic colitis 05/22/2013    Dr. Rhea Belton  . Anemia     Past Surgical History  Procedure Laterality Date  . No past surgeries      Family History  Problem Relation Age of Onset  . Osteoarthritis Maternal Grandfather   . Diabetes Paternal Grandmother     History  Substance Use Topics  . Smoking status: Never Smoker   . Smokeless tobacco: Never Used  . Alcohol Use: No    Allergies: No Known Allergies  Prescriptions prior to admission  Medication Sig Dispense Refill  . acetaminophen-codeine (TYLENOL #3) 300-30 MG per tablet Take 1-2 tablets by mouth every 6 (six) hours as needed for pain.  30 tablet  0  . mesalamine (CANASA) 1000 MG suppository Place 1,000 mg rectally at bedtime.        Review of Systems  Constitutional: Positive for malaise/fatigue. Negative for fever.  Gastrointestinal: Positive for nausea, vomiting (x 1) and abdominal pain (cramping).  Genitourinary:       Vaginal bleeding  All other systems reviewed and are negative.   Physical Exam   Blood pressure 123/74, pulse 82, temperature 98.4 F (36.9 C), resp. rate 20, height 5\' 2"  (1.575 m), weight 70.126 kg (154 lb 9.6 oz), last  menstrual period 09/30/2013.  Physical Exam  Constitutional: She is oriented to person, place, and time. She appears well-developed and well-nourished. No distress.  HENT:  Head: Normocephalic.  Neck: Normal range of motion. Neck supple.  Cardiovascular: Normal rate, regular rhythm and normal heart sounds.   Respiratory: Effort normal and breath sounds normal.  GI: Soft. She exhibits no mass. There is tenderness (midpelvic). There is no guarding.  Genitourinary: There is bleeding (negative clots; moderate bleeding) around the vagina.  Neurological: She is alert and oriented to person, place, and time. She has normal reflexes.  Skin: Skin is warm and dry.    MAU Course  Procedures Results for orders placed during the hospital encounter of 11/03/13 (from the past 24 hour(s))  URINALYSIS, ROUTINE W REFLEX MICROSCOPIC     Status: Abnormal   Collection Time    11/03/13  9:30 AM      Result Value Range   Color, Urine YELLOW  YELLOW   APPearance CLEAR  CLEAR   Specific Gravity, Urine 1.010  1.005 - 1.030   pH 5.5  5.0 - 8.0   Glucose, UA NEGATIVE  NEGATIVE mg/dL   Hgb urine dipstick MODERATE (*) NEGATIVE   Bilirubin Urine NEGATIVE  NEGATIVE   Ketones, ur NEGATIVE  NEGATIVE mg/dL   Protein, ur NEGATIVE  NEGATIVE mg/dL   Urobilinogen, UA 0.2  0.0 - 1.0 mg/dL   Nitrite NEGATIVE  NEGATIVE   Leukocytes, UA NEGATIVE  NEGATIVE  URINE MICROSCOPIC-ADD ON     Status: None   Collection Time    11/03/13  9:30 AM      Result Value Range   Squamous Epithelial / LPF RARE  RARE   WBC, UA 0-2  <3 WBC/hpf  POCT PREGNANCY, URINE     Status: None   Collection Time    11/03/13  9:50 AM      Result Value Range   Preg Test, Ur NEGATIVE  NEGATIVE  CBC     Status: None   Collection Time    11/03/13 10:15 AM      Result Value Range   WBC 6.5  4.0 - 10.5 K/uL   RBC 4.42  3.87 - 5.11 MIL/uL   Hemoglobin 13.3  12.0 - 15.0 g/dL   HCT 16.1  09.6 - 04.5 %   MCV 87.8  78.0 - 100.0 fL   MCH 30.1  26.0 -  34.0 pg   MCHC 34.3  30.0 - 36.0 g/dL   RDW 40.9  81.1 - 91.4 %   Platelets 267  150 - 400 K/uL    Ultrasound: IMPRESSION:  Unremarkable study.   Assessment and Plan  Break through bleeding with Implanon  Plan: RX Estrogen 1.25 mg q day x 5 days.  Provide reassurance, bleeding common with new insertion Follow-up if bleeding worsens or does not improve in 2 months.   [facility] 11/03/2013, 10:13 AM

## 2013-11-09 NOTE — MAU Provider Note (Signed)
Attestation of Attending Supervision of Advanced Practitioner (CNM/NP): Evaluation and management procedures were performed by the Advanced Practitioner under my supervision and collaboration.  I have reviewed the Advanced Practitioner's note and chart, and I agree with the management and plan.  HARRAWAY-SMITH, Dewana Ammirati 1:13 PM

## 2013-11-10 ENCOUNTER — Telehealth: Payer: Self-pay | Admitting: Internal Medicine

## 2013-11-11 NOTE — Telephone Encounter (Signed)
Samples given to Ms Linda Taylor. Given the telephone number and advised to call us prior to coming so we can have samples ready for her.

## 2013-11-13 ENCOUNTER — Encounter: Payer: Self-pay | Admitting: Family Medicine

## 2013-12-01 ENCOUNTER — Telehealth: Payer: Self-pay | Admitting: Internal Medicine

## 2013-12-22 NOTE — Telephone Encounter (Signed)
Saw pt when she came in and told her we do not have samples and the rep doesn't give [place] many I will call her when we do have some

## 2014-01-05 ENCOUNTER — Telehealth: Payer: Self-pay | Admitting: Internal Medicine

## 2014-01-05 NOTE — Telephone Encounter (Signed)
Spoke to pt told her I will leave her some samples at our front desk. She said her brother will be by to pick them up

## 2014-01-05 NOTE — Telephone Encounter (Signed)
Pt has been told several times there is no pt assistance program for Canasa. Per [employer] rep all they can give her are coupons and she can keep calling for samples. I have left her samples since I just spoke to her I will also leave her the coupon from the [employer] website

## 2014-05-06 ENCOUNTER — Telehealth: Payer: Self-pay | Admitting: Internal Medicine

## 2014-05-10 NOTE — Telephone Encounter (Signed)
LVM FOR PT TO CALL ME BACK REGARDING SAMPLES.

## 2014-08-13 ENCOUNTER — Telehealth: Payer: Self-pay | Admitting: Internal Medicine

## 2014-08-13 NOTE — Telephone Encounter (Signed)
Sorry, we no longer carry samples of medications.

## 2014-08-17 NOTE — Telephone Encounter (Signed)
Advised patient that we are no longer able to provide cansasa suppositories. However, she is due for an office visit in 08/2014. She has scheduled an appointment for 10/01/14 @ 8:45 with Dr Rhea BeltonPyrtle for routine follow up of ulcerative proctitis.

## 2014-10-19 ENCOUNTER — Ambulatory Visit: Payer: Self-pay | Admitting: Internal Medicine

## 2014-11-01 ENCOUNTER — Encounter (HOSPITAL_COMMUNITY): Payer: Self-pay | Admitting: *Deleted

## 2014-12-16 ENCOUNTER — Encounter: Payer: Self-pay | Admitting: Internal Medicine

## 2014-12-16 ENCOUNTER — Ambulatory Visit: Payer: Self-pay | Admitting: Internal Medicine

## 2014-12-16 NOTE — Progress Notes (Signed)
The patient's chart has been reviewed by Dr. Rhea BeltonPyrtle  and the recommendations are noted below.   Follow-up necessary. Schedule patient for next available appointment.  Outcome of communication with the patient:  Patient is non [place] speaking. I will send a letter.

## 2015-12-30 ENCOUNTER — Ambulatory Visit: Payer: Self-pay | Admitting: Internal Medicine

## 2016-07-17 ENCOUNTER — Encounter (HOSPITAL_COMMUNITY): Payer: Self-pay | Admitting: *Deleted

## 2017-07-23 ENCOUNTER — Ambulatory Visit
Admission: RE | Admit: 2017-07-23 | Discharge: 2017-07-23 | Disposition: A | Discharge status: Home / Self Care | Attending: Student in an Organized Health Care Education/Training Program

## 2017-07-23 DIAGNOSIS — D649 Anemia, unspecified: Principal | ICD-10-CM

## 2017-07-23 DIAGNOSIS — K51919 Ulcerative colitis, unspecified with unspecified complications: Secondary | ICD-10-CM

## 2017-07-23 MED ORDER — MESALAMINE 400 MG CAPSULE,DELAYED RELEASE
ORAL_CAPSULE | Freq: Three times a day (TID) | ORAL | 5 refills | 0 days | Status: CP
Start: 2017-07-23 — End: 2017-08-22

## 2017-07-23 MED ORDER — PREDNISONE 10 MG TABLET
ORAL_TABLET | Freq: Every day | ORAL | 0 refills | 0 days | Status: CP
Start: 2017-07-23 — End: 2018-07-14

## 2017-07-23 MED ORDER — AZATHIOPRINE 50 MG TABLET
ORAL_TABLET | Freq: Every day | ORAL | 0 refills | 0.00000 days | Status: CP
Start: 2017-07-23 — End: 2017-08-15

## 2017-08-15 MED ORDER — AZATHIOPRINE 50 MG TABLET
ORAL_TABLET | Freq: Every day | ORAL | 2 refills | 0 days | Status: CP
Start: 2017-08-15 — End: 2017-08-23

## 2017-08-23 MED ORDER — AZATHIOPRINE 50 MG TABLET
ORAL_TABLET | Freq: Every day | ORAL | 2 refills | 0 days | Status: SS
Start: 2017-08-23 — End: 2018-02-19

## 2017-10-01 ENCOUNTER — Ambulatory Visit: Admission: RE | Admit: 2017-10-01 | Discharge: 2017-10-01 | Disposition: A | Discharge status: Home / Self Care

## 2017-10-01 DIAGNOSIS — K5100 Ulcerative (chronic) pancolitis without complications: Principal | ICD-10-CM

## 2017-10-01 MED ORDER — AZATHIOPRINE 50 MG TABLET: tablet | 3 refills | 0 days

## 2017-10-01 MED ORDER — AZATHIOPRINE 50 MG TABLET
ORAL_TABLET | Freq: Every day | ORAL | 1 refills | 0.00000 days | Status: CP
Start: 2017-10-01 — End: 2017-10-01

## 2017-10-01 MED FILL — AZATHIOPRINE/50MG/TABS: AZATHIOPRINE/50MG/TABS | 14 days supply | Qty: 49 | Fill #0

## 2018-01-07 ENCOUNTER — Ambulatory Visit: Admit: 2018-01-07 | Discharge: 2018-01-07

## 2018-01-07 DIAGNOSIS — K5100 Ulcerative (chronic) pancolitis without complications: Principal | ICD-10-CM

## 2018-01-07 MED ORDER — CHOLECALCIFEROL (VITAMIN D3) 1,250 MCG (50,000 UNIT) CAPSULE
ORAL_CAPSULE | ORAL | 0 refills | 0 days | Status: CP
Start: 2018-01-07 — End: 2019-01-07

## 2018-02-19 ENCOUNTER — Encounter
Admit: 2018-02-19 | Discharge: 2018-02-19 | Attending: Student in an Organized Health Care Education/Training Program | Primary: Student in an Organized Health Care Education/Training Program

## 2018-02-19 ENCOUNTER — Ambulatory Visit: Admit: 2018-02-19 | Discharge: 2018-02-19

## 2018-02-19 DIAGNOSIS — K5190 Ulcerative colitis, unspecified, without complications: Principal | ICD-10-CM

## 2018-02-19 MED ORDER — HYDROCORTISONE 100 MG/60 ML ENEMA
ENEMA | Freq: Every evening | RECTAL | 1 refills | 0.00000 days | Status: CP
Start: 2018-02-19 — End: 2018-03-21

## 2018-02-19 MED ORDER — AZATHIOPRINE 50 MG TABLET
ORAL_TABLET | Freq: Every day | ORAL | 11 refills | 0 days | Status: CP
Start: 2018-02-19 — End: 2018-04-28

## 2018-02-20 MED ORDER — HYDROCORTISONE 100 MG/60 ML ENEMA
Freq: Every evening | RECTAL | 1 refills | 0.00000 days | Status: CP
Start: 2018-02-20 — End: 2018-07-14

## 2018-02-20 MED ORDER — HYDROCORTISONE 100 MG/60 ML ENEMA: 100 mg | mL | Freq: Every evening | 1 refills | 0 days | Status: AC

## 2018-03-25 ENCOUNTER — Ambulatory Visit: Admit: 2018-03-25 | Discharge: 2018-03-26 | Payer: MEDICAID

## 2018-03-25 DIAGNOSIS — K5100 Ulcerative (chronic) pancolitis without complications: Principal | ICD-10-CM

## 2018-04-14 ENCOUNTER — Ambulatory Visit: Admit: 2018-04-14 | Discharge: 2018-04-15

## 2018-04-14 DIAGNOSIS — K5100 Ulcerative (chronic) pancolitis without complications: Principal | ICD-10-CM

## 2018-04-28 MED ORDER — AZATHIOPRINE 50 MG TABLET
ORAL_TABLET | Freq: Every day | ORAL | 2 refills | 0.00000 days | Status: CP
Start: 2018-04-28 — End: 2018-04-28

## 2018-04-28 MED ORDER — AZATHIOPRINE 50 MG TABLET: tablet | 2 refills | 0 days

## 2018-04-29 ENCOUNTER — Ambulatory Visit: Admit: 2018-04-29 | Discharge: 2018-04-30 | Payer: MEDICAID

## 2018-04-29 DIAGNOSIS — K5100 Ulcerative (chronic) pancolitis without complications: Principal | ICD-10-CM

## 2018-05-06 ENCOUNTER — Ambulatory Visit: Admit: 2018-05-06 | Discharge: 2018-05-07 | Payer: MEDICAID

## 2018-05-06 DIAGNOSIS — D649 Anemia, unspecified: Secondary | ICD-10-CM

## 2018-05-06 DIAGNOSIS — Z598 Other problems related to housing and economic circumstances: Secondary | ICD-10-CM

## 2018-05-06 DIAGNOSIS — K51919 Ulcerative colitis, unspecified with unspecified complications: Principal | ICD-10-CM

## 2018-05-06 DIAGNOSIS — Z789 Other specified health status: Secondary | ICD-10-CM

## 2018-05-27 ENCOUNTER — Ambulatory Visit: Admit: 2018-05-27 | Discharge: 2018-05-28 | Payer: MEDICAID

## 2018-05-27 DIAGNOSIS — K5100 Ulcerative (chronic) pancolitis without complications: Principal | ICD-10-CM

## 2018-05-27 MED FILL — AZATHIOPRINE/50MG/TABS: AZATHIOPRINE/50MG/TABS | 30 days supply | Qty: 120 | Fill #0

## 2018-07-02 ENCOUNTER — Ambulatory Visit: Admit: 2018-07-02 | Discharge: 2018-07-14 | Disposition: A | Discharge status: Home / Self Care | Payer: MEDICAID

## 2018-07-02 ENCOUNTER — Encounter
Admit: 2018-07-02 | Discharge: 2018-07-14 | Disposition: A | Discharge status: Home / Self Care | Payer: MEDICAID | Attending: Anesthesiology

## 2018-07-02 ENCOUNTER — Encounter
Admit: 2018-07-02 | Discharge: 2018-07-14 | Disposition: A | Discharge status: Home / Self Care | Payer: MEDICAID | Attending: Nurse Practitioner

## 2018-07-02 DIAGNOSIS — R109 Unspecified abdominal pain: Principal | ICD-10-CM

## 2018-07-08 NOTE — Unmapped (Addendum)
Hospital Course  Brandy Moore Brandy Moore is a 27 y.o. female with PMH of ulcerative colitis was admitted for ulcerative colitis flare and biopsy-proven CMV colitis.    Ulcerative colitis flare with superimposed CMV colitis: Ms. Brandy Moore presented with bloody bowel movements and abdominal pain. CTA revealed thickening of the rectosigmoid colon with mesenteric lymphadenopathy consistent with known UC. On 7/3, biopsy confirmed CMV colitis which was treated with ganciclovir 5mg /kg/dose q12h. Symptoms did not improve with oral prednisone so IV solumedrol was started and stool became less frequent, non-bloody, and abdominal pain improved. Flex sigmoidoscopy was planned for 7/10 however due to significant symptomatic improvement, this was delayed to see if improvement sustained on oral steroids. Unfortunately her symptoms worsened.  She was placed back on IV Solumedrol and went for colonoscopy 7/12 which showed continuous and circumferential inflammation from rectum to sigmoid colon with??normal mucosa in the rest of the colon. She was started on mesalamine enemas.  She was later transitioned back to PO steroids and symptoms stabilized. At the time of discharge she was having formed stool with minimal blood streaking.  She was discharged on prednisone 40 mg daily, valganciclovir 900 mg twice daily, with plans for tofacitanib to begin outpatient with Dr. Raphael Gibney. Follow up appointment 08/08/18; will continue prednisone and valganciclovir until then.     Impaired glucose tolerance: Ms. Brandy Moore developed hyperglycemia on 07/08/18 after transitioning back to IV solumedrol from oral prednisone. A1c 6.1.  She had previously been on metformin while receiving steroids and metformin 500mg  daily initiated at discharge.  She was encouraged to follow up with her PCP.

## 2018-07-08 NOTE — Unmapped (Signed)
Lower GI ([facility])Surgery Consult Note Followup    Requesting Attending Physician:  Eusebio Me, MD  Service Requesting Consult:  [facility]  Service Providing Consult: Lower GI  Consulting Attending: Sophronia Simas    Assessment:  27 y.o. year old female with medically refractory ulcerative colitis on Remicade and azathioprine. Appears to be improving with steroid course as patient is having more formed stools and significantly less pain.       PLAN:   The case was discussed with the gastroenterology team given her moderate improvement, although symptoms persist. GI planning to repeat flexible sigmoidoscopy tomorrow to assess her colon. We will plan to delay any operative intervention at this time.    This case was discussed with Dr. Len Blalock, the attending GI surgeon.  Harvie Junior, MD  General Surgery, PGY5  [phone]    Subjective:  Continues to have blood-tinged bowel movements but has improved. Pain significantly improved. Stool more formed    Objective:    Vital signs:  BP 116/58  - Pulse 86  - Temp 36.9 ??C (Oral)  - Resp 16  - Ht 157.5 cm (5' 2.01)  - Wt 78 kg (172 lb)  - LMP 06/09/2018  - SpO2 99%  - BMI 31.45 kg/m??     Intake/Output:  I/O last 3 completed shifts:  In: 361 [P.O.:240; I.V.:121]  Out: -     Physical Exam:  General: NAD, lying comfortably in bed  HEENT: atraumatic, sclera anicteric  Pulm: nonlabored breathing on RA  CV: RRR  Abd: soft, minimally tender in LLQ, nondistendedSkin: WWP  Neuro: no focal deficits  Psych: A&Ox3    Data Review:  Lab Results   Component Value Date    WBC 7.2 07/07/2018    HGB 9.8 (L) 07/07/2018    HCT 31.4 (L) 07/07/2018    PLT 562 (H) 07/07/2018       Lab Results   Component Value Date    NA 134 (L) 07/08/2018    K 4.4 07/08/2018    CL 100 07/08/2018    CO2 27.0 07/08/2018    BUN 10 07/08/2018    CREATININE 0.49 (L) 07/08/2018    CALCIUM 9.1 07/08/2018

## 2018-07-09 LAB — BASIC METABOLIC PANEL
ANION GAP: 11 mmol/L (ref 9–15)
BLOOD UREA NITROGEN: 16 mg/dL (ref 7–21)
BUN / CREAT RATIO: 36
CHLORIDE: 100 mmol/L (ref 98–107)
CO2: 25 mmol/L (ref 22.0–30.0)
CREATININE: 0.45 mg/dL — ABNORMAL LOW (ref 0.60–1.00)
EGFR CKD-EPI AA FEMALE: >90 mL/min/{1.73_m2} (ref >=60)
GLUCOSE RANDOM: 171 mg/dL (ref 65–179)
POTASSIUM: 4.4 mmol/L (ref 3.5–5.0)
SODIUM: 136 mmol/L (ref 135–145)

## 2018-07-09 LAB — CO2: Carbon dioxide:SCnc:Pt:Ser/Plas:Qn:: 25

## 2018-07-09 LAB — C-REACTIVE PROTEIN: C reactive protein:MCnc:Pt:Ser/Plas:Qn:: <5

## 2018-07-09 MED ORDER — TOFACITINIB 10 MG TABLET
ORAL_TABLET | Freq: Two times a day (BID) | ORAL | 0 refills | 0.00000 days | Status: CP
Start: 2018-07-09 — End: 2018-10-10

## 2018-07-09 MED ORDER — TOFACITINIB 10 MG TABLET: tablet | 0 refills | 0 days

## 2018-07-09 MED ORDER — TOFACITINIB 10 MG TABLET: 10 mg | tablet | Freq: Two times a day (BID) | 0 refills | 0 days | Status: AC

## 2018-07-09 MED ORDER — LISINOPRIL 10 MG TABLET
ORAL_TABLET | 0 refills | 0 days
Start: 2018-07-09 — End: 2019-07-09

## 2018-07-09 NOTE — Unmapped (Addendum)
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 *        Pressure Ulcer(s)    Active Pressure Ulcer     None                 Brandy Moore is a 27 y.o. 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      Brandy Moore is a 27 y.o. 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     Ulcerative colitis flare with superimposed CMV colitis: Now with biopsy proven CMV colitis. At baseline, has flare approximately once per year and had been managed with Remicaide and azathioprine prior to CMV infection. Was on PO steroids, transitinoed back to IV with significantly improved symptoms.  Plan go back to PO prednisone today. If patient continues to improve she can likely discharge tomorrow with outpatient management.  If she worsens will plan for flex signoidoscopy tomorrow 7/11 to evaluate whether should be medically managed versus colectomy (GI surgery aware of patient and following).  - NPO after midnight for Flex sigmoidoscopy 07/10/18  - Continue IV solumedrol 20mg  TID---> prednisone 40 mg once daily 7/10  - Continue ganciclovir 5mg /kg/dose q12h  - CRP daily + albumin periodically   - Continue to follow recommendations from GI & GI surgery  - pain control with acetaminophen 650mg  po q4h PRN mild pain, IV morphine transitioned to home oxycodone 5mg  once daily PRN.    Impaired glucose tolerance, steroid induced hyperglycemia: Hemoglobin A1C 6.1. Anticipate BG will improve now that we have transitioned to PO, once daily, steroids. Will need PCP follow up.  - accuchecks and add insulin if indicated/GI plans to continue steroids.   - hemoglobin A1C pending    Prophylaxis:   - Lovenox 40mg  QD ___________________________________________________________________    Subjective:  Doing well. Worried she will need steroids for hyperglycemia, provided education and need for PCP follow up.     Labs/Studies:  Labs and Studies from the last 24hrs per EMR and Reviewed    Objective:  BP 113/56  - Pulse 85  - Temp 36.7 ??C (98.1 ??F) (Oral)  - Resp 18  - Ht 157.5 cm (5' 2.01)  - Wt 77.6 kg (171 lb 1.2 oz)  - LMP 06/09/2018  - SpO2 98%  - BMI 31.28 kg/m??     General: well-appearing female in NAD, alert & oriented   HEENT: atraumatic, normocephalic, EOM grossly intact  Cardiovascular: RRR, no murmurs, rubs, or gallops  Respiratory: Lungs CTA, no increased work of breathing  GI/Abdomen: Bowel sounds present x 4 quadrants, no tenderness to palpation, no masses appreciated  Extremities: warm, dry, no cyanosis or pallor. No edema.   Psych: calm, cooperative, responds appropriately

## 2018-07-09 NOTE — Unmapped (Signed)
Per test claim for [facility] 10MG  TABLET at the [facility], patient needs Medication Assistance Program for Manufacturer Assistance.

## 2018-07-09 NOTE — Unmapped (Signed)
Attempted PA with [employer] - could not find pt insurance via CMM. Sent xeljanz 10mg  BID prescription per Dr. Raphael Gibney to [facility]

## 2018-07-09 NOTE — Unmapped (Signed)
Patient alert and oriented, VSS. Pain well controlled. No acute events overnight, wctm.       Problem: Adult Inpatient Plan of Care  Goal: Plan of Care Review  Outcome: Progressing  Goal: Patient-Specific Goal (Individualization)  Outcome: Progressing  Goal: Absence of Hospital-Acquired Illness or Injury  Outcome: Progressing  Goal: Optimal Comfort and Wellbeing  Outcome: Progressing  Goal: Readiness for Transition of Care  Outcome: Progressing  Goal: Rounds/Family Conference  Outcome: Progressing     Problem: Nausea and Vomiting  Goal: Fluid and Electrolyte Balance  Outcome: Progressing

## 2018-07-09 NOTE — Unmapped (Signed)
GASTROENTEROLOGY (Luminal) INPATIENT FOLLOW UP NOTE     Requesting Attending Physician:  Eusebio Me, MD    Reason for Consult:  Brandy Moore is a 27 y.o. female seen in consultation at the request of Dr. Eusebio Me, MD for UC flare.      ASSESSMENT:  27 y.o. year old female with left-sided ulcerative colitis on Remicade and azathioprine admitted for UC flare.  This was confirmed endoscopically on July 3, though at least partially driven by CMV colitis (biopsy proven). We believe there is still a component of active UC, and this is still likely a loss of response to Remicade--or at least too high risk for loss of response to persist with this therapy, given the state of her colon.  Interestingly she has been dramatically responsive to IV steroids but disappointingly responsive to oral prednisone over the weekend.  However she is improving enough with the current IV regimen that we are optimistic that this can be preserved long enough to get her to [facility] as an outpatient.  This has a fast onset of action and is her best chance for remission with medical therapy. If she has worsening symptoms inpatient or outpatient, then we should confirm with an endoscopic reassessment, and if persistent severe colitis, proceed to colectomy.  As both of these options would not be helped by additional immune suppression with thiopurine, we should discontinue Imuran.    RECOMMENDATIONS:  - continue IV ganciclovir  - daily CRP  - continue IV solumedrol 20mg  TID through tomorrow 4 AM dose. If she is having continued improvement in AM, switch to 40mg  prednisone daily prior to noon solumedrol dose. If not responding, continue IV and will plan for flexible sigmoidoscopy Thursday AM  - stop azathioprine  - if lack of response to above, demonstrated on repeat flex sig, then she would still need colectomy, as there is no medical option that is available as an inpatient and that would act fast enough to allow for her discharge.     Patient seen and discussed with Dr. Raphael Gibney, who agrees with plan.      Maryclare Bean, MD, [facility]  Gastroenterology Fellow, PGY6  Pager [phone]      Interval History:  Much improved on IV steroids for just 36h. Lower bowel frequency, but more importantly to her is much decreased abdominal pain and blood with defecation. Stool has some form, and some non-blood components today.     CRP normalized. She remains open to surgery, particularly in the future, but still very much hopes to find a medical solution, at least for now.       Medications:  Current Facility-Administered Medications   Medication Dose Route Frequency Provider Last Rate Last Dose   ??? acetaminophen (TYLENOL) tablet 650 mg  650 mg Oral Q4H PRN Peter Garter, MD   650 mg at 07/06/18 2028   ??? azaTHIOprine (IMURAN) tablet 200 mg  200 mg Oral Daily Peter Garter, MD   200 mg at 07/08/18 2841   ??? calcium carbonate (TUMS) chewable tablet 200 mg of elem calcium  200 mg of elem calcium Oral TID PRN Georgiann Mccoy, MD   200 mg of elem calcium at 07/08/18 0115   ??? enoxaparin (LOVENOX) syringe 40 mg  40 mg Subcutaneous Q24H [facility] [place]   40 mg at 07/08/18 2111   ??? famotidine (PEPCID) tablet 20 mg  20 mg Oral BID Hurman Horn, MD   20 mg at 07/08/18  2114   ??? ganciclovir (CYTOVENE) 390 mg in sodium chloride (NS) 0.9 % 100 mL IVPB  390 mg Intravenous Q12H Naseem Daphene Jaeger, MD 117.8 mL/hr at 07/08/18 1305 390 mg at 07/08/18 1305   ??? lactated Ringers infusion  10 mL/hr Intravenous Continuous Andrey Farmer, MD 10 mL/hr at 07/02/18 1523 10 mL/hr at 07/02/18 1523   ??? methylPREDNISolone sodium succinate (PF) (Solu-MEDROL) injection 20 mg  20 mg Intravenous Q8H [facility], AGNP   20 mg at 07/08/18 2109   ??? MORPhine 4 mg/mL injection 2 mg  2 mg Intravenous Q4H PRN Terri Piedra, AGNP   2 mg at 07/08/18 0153   ??? ondansetron (ZOFRAN) injection 4 mg  4 mg Intravenous Q8H PRN Naseem Daphene Jaeger, MD       ??? ondansetron (ZOFRAN-ODT) disintegrating tablet 4 mg  4 mg Oral Q12H PRN Naseem Daphene Jaeger, MD   4 mg at 07/08/18 0311   ??? polyethylene glycol (MIRALAX) packet 17 g  17 g Oral Daily PRN Peter Garter, MD       ??? simethicone [facility]) chewable tablet 80 mg  80 mg Oral Q6H PRN Georgiann Mccoy, MD   80 mg at 07/04/18 2248       Vital Signs:  Temp:  [36.7 ??C-37 ??C] 36.9 ??C  Heart Rate:  [65-86] 65  Resp:  [16-18] 18  BP: (115-120)/(56-63) 116/56  SpO2:  [97 %-100 %] 100 %    Intake/Output last 3 shifts:  I/O last 3 completed shifts:  In: 361 [P.O.:240; I.V.:121]  Out: -     Physical Exam:  Constitutional: Alert, Oriented x 3, No acute distress, well nourished and well hydrated.   CV: Regular rate and rhythm, normal S1, S2. No murmurs. No JVD.  Abdomen: soft, normal bowel sounds, non-distended, non-tender to palpation.   Skin: No rashes, jaundice or skin lesions noted  Neuro: No focal deficits.     Diagnostic Studies:   Labs:  Recent Labs     07/06/18  0736 07/07/18  0625   WBC 8.1 7.2   HGB 9.7* 9.8*   HCT 31.1* 31.4*   PLT 566* 562*     Recent Labs     07/06/18  0736 07/07/18  0625 07/08/18  0440   NA 135 136 134*   K 3.7 3.9 4.4   CL 102 101 100   BUN 15 12 10    CREATININE 0.57* 0.46* 0.49*   GLU 133 106 239*     No results for input(s): PROT, ALBUMIN, AST, ALT, ALKPHOS, BILITOT in the last 72 hours.    Invalid input(s):  BILIDIR  No results for input(s): INR, APTT, FIBRINOGEN in the last 72 hours.    Imaging:  07/02/18 CT A/P:   Thickening of the rectosigmoid colon with mild adjacent fat stranding and mesenteric lymphadenopathy, likely reactive, consistent with patient's history of ulcerative colitis. No evidence of colonic perforation or abscess.  --Sclerosis about bilateral SI joints, suggestive of sacroiliitis.    Microbiology:  cdiff neg, GIPP neg  07/02/18 Bx: CMV + in tissue  07/02/18 serum CMV PCR quant 66    GI Procedures:   07/02/18 flex sig:   The perianal and digital rectal examinations were normal.       Inflammation characterized by congestion (edema), friability,        granularity and loss of vascularity was found in a continuous and        circumferential pattern from the rectum to  the extent of examination in        the left transverse colon. The most severe disease was in the rectum and        sigmoid colon. The sigmoid colon colon was ulcerated. Mild to moderate        disease noted in the transverse colon. No sites were spared. This was        severe, and when compared to previous examinations, the findings are        worsened. Biopsies were taken with a cold forceps for histology and        viral PCR for CMV, HVS and adenovirus. Estimated blood loss was minimal.        Retroflexion not performed in the rectum.                                                                                 Impression:        - Preparation of the colon was fair.                     - Colitis. Inflammation was found from the rectum to the                      transverse colon. This was severe, worsened compared to                      previous examinations. Biopsied for histology and viral                      PCR for CMV, HVS and adenovirus.    01/2018 colonoscopy:      - Moderate Mayo 2 colitis in the sigmoid colon (up to                      30cm). Mild Mayo 1 colitis from proximal sigmoid to distal                      transverse. The right colon was spared. Biopsied.                     - The examined portion of the ileum was normal.

## 2018-07-10 LAB — C-REACTIVE PROTEIN: C reactive protein:MCnc:Pt:Ser/Plas:Qn:: <5

## 2018-07-10 NOTE — Unmapped (Signed)
07/09/2018    [facility]  INPATIENT NOTE    Referring provider: Referred Self  No address on file    RE: Brandy Moore; DOB: [birth year]    HISTORY OF PRESENT ILLNESS:  27 y.o. year old female with left-sided ulcerative colitis on Remicade and azathioprine admitted for UC flare.     INTERVAL EVENTS:  She reports significant improvement on IV solumedrol.   CRP now undetectable.    SUMMARY RECOMMENDATIONS:  1. As she is improving, we do not feel that flexible sigmoidoscopy is warranted at this time.  2. Transition to PO prednisone 40 mg per day - no taper  3. Patient to follow up with Dr. Raphael Gibney on 8/9 for evaluation of tofacitinib start  4. Okay for discharge on 7/11    Vito Backers, MD MSc  Gastroenterology Fellow PGY-7  [employer] [facility]    ------------------------------------------------------    Physical Exam:  Gen: NAD  HEENT: sclera anicteric  OP: MMM, Moore with no erythema or ulcers  CV: RRR  Resp: CTAB without respiratory distress  Abd: +BS, soft, NT/ND, no HSM  Ext: no peripheral edema  Neuro: grossly unremarkable  Skin: no abrasions, erythema or rashes appreciated

## 2018-07-10 NOTE — Unmapped (Signed)
Patient alert and oriented. Assessment as charted. Will cont to monitor patient. Patient's goal is to have test tomorrow and be discharges  depending results.

## 2018-07-10 NOTE — Unmapped (Signed)
Patient alert and oriented, VSS. NPO for procedure today. No acute events overnight, wctm.         Problem: Adult Inpatient Plan of Care  Goal: Plan of Care Review  Outcome: Progressing  Goal: Patient-Specific Goal (Individualization)  Outcome: Progressing  Goal: Absence of Hospital-Acquired Illness or Injury  Outcome: Progressing  Goal: Optimal Comfort and Wellbeing  Outcome: Progressing  Goal: Readiness for Transition of Care  Outcome: Progressing  Goal: Rounds/Family Conference  Outcome: Progressing     Problem: Nausea and Vomiting  Goal: Fluid and Electrolyte Balance  Outcome: Progressing

## 2018-07-10 NOTE — Unmapped (Addendum)
Daily Progress Note    Assessment/Plan:    Principal Problem:    Ulcerative pancolitis without complication (CMS-HCC)  Resolved Problems:    * No resolved hospital problems. *        Pressure Ulcer(s)    Active Pressure Ulcer     None                 Brandy Moore is a 27 y.o. female who presented to [facility] with Ulcerative pancolitis without complication (CMS-HCC).      Brandy Moore is a 27 y.o. 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     Ulcerative colitis flare with superimposed CMV colitis: Initially treating with PO steroids, transition to IV with significant improvement in symptoms, attempted to transition back to PO steroids yesterday and patient had worsening in abdominal pain, increase stool frequency and more blood in stool.   - Appreciate GI recommendations  - GI surgery peripherally following, no plans for colectomy at this time   - NPO after midnight for Flex sigmoidoscopy 07/11/18  - Restart IV solumedrol 20 mg TID and discontinue PO prednisone   - Continue ganciclovir 5mg /kg/dose q12h  - CRP daily + albumin periodically   - pain control with acetaminophen 650mg  po q4h PRN mild pain, home oxycodone 5mg  once daily PRN.    Impaired glucose tolerance, steroid induced hyperglycemia: Hemoglobin A1C 6.1. Patient has been on metformin in the past while receiving steroids  - accuchecks and add insulin if indicated/GI plans to continue steroids.   - consider getting glucometer for discharge  - likely restart metformin at discharge.     Prophylaxis:   - Lovenox 40mg  QD     ___________________________________________________________________    Subjective:  Had 3 bloody BMs with increased pain/cramping overnight.  Seen with interpreter    Labs/Studies:  Labs and Studies from the last 24hrs per EMR and Reviewed    Objective:  BP 122/73  - Pulse 65  - Temp 36.8 ??C (98.2 ??F) (Oral)  - Resp 18  - Ht 157.5 cm (5' 2.01)  - Wt 75.8 kg (167 lb 1.7 oz)  - LMP 06/09/2018  - SpO2 99%  - BMI 30.56 kg/m??     General: well-appearing female in NAD  Cardiovascular: RRR, no murmurs, rubs, or gallops  Respiratory: Lungs CTA, no increased work of breathing  GI/Abdomen: Bowel sounds present x 4 quadrants, TTP in mid-lower abdomen, no masses appreciated  Extremities: warm, dry, no cyanosis or pallor. No edema.   Psych: calm, cooperative, responds appropriately

## 2018-07-11 DIAGNOSIS — R109 Unspecified abdominal pain: Principal | ICD-10-CM

## 2018-07-11 LAB — CBC
HEMATOCRIT: 32.1 % — ABNORMAL LOW (ref 36.0–46.0)
HEMOGLOBIN: 10.4 g/dL — ABNORMAL LOW (ref 12.0–16.0)
MEAN CORPUSCULAR HEMOGLOBIN CONC: 32.3 g/dL (ref 31.0–37.0)
MEAN CORPUSCULAR HEMOGLOBIN: 27.6 pg (ref 26.0–34.0)
MEAN PLATELET VOLUME: 7.2 fL (ref 7.0–10.0)
PLATELET COUNT: 606 10*9/L — ABNORMAL HIGH (ref 150–440)
RED BLOOD CELL COUNT: 3.77 10*12/L — ABNORMAL LOW (ref 4.00–5.20)
RED CELL DISTRIBUTION WIDTH: 14.5 % (ref 12.0–15.0)
WBC ADJUSTED: 6.6 10*9/L (ref 4.5–11.0)

## 2018-07-11 LAB — BASIC METABOLIC PANEL
ANION GAP: 7 mmol/L — ABNORMAL LOW (ref 9–15)
BUN / CREAT RATIO: 30
CALCIUM: 8.7 mg/dL (ref 8.5–10.2)
CHLORIDE: 101 mmol/L (ref 98–107)
CO2: 28 mmol/L (ref 22.0–30.0)
CREATININE: 0.43 mg/dL — ABNORMAL LOW (ref 0.60–1.00)
EGFR CKD-EPI AA FEMALE: >90 mL/min/{1.73_m2} (ref >=60)
EGFR CKD-EPI NON-AA FEMALE: >90 mL/min/{1.73_m2} (ref >=60)
GLUCOSE RANDOM: 115 mg/dL (ref 65–179)
POTASSIUM: 4.2 mmol/L (ref 3.5–5.0)
SODIUM: 136 mmol/L (ref 135–145)

## 2018-07-11 LAB — C-REACTIVE PROTEIN: C reactive protein:MCnc:Pt:Ser/Plas:Qn:: <5

## 2018-07-11 LAB — MEAN CORPUSCULAR HEMOGLOBIN CONC: Lab: 32.3

## 2018-07-11 LAB — CREATININE: Creatinine:MCnc:Pt:Ser/Plas:Qn:: 0.43 — ABNORMAL LOW

## 2018-07-11 NOTE — Unmapped (Addendum)
GASTROENTEROLOGY TREATMENT PLAN NOTE     Requesting Attending Physician :  Eusebio Me, MD    Reason for Consult:    Brandy Moore Ernest Haber is a 27 y.o. female seen in consultation at the request of Dr. Eusebio Me, MD for ulcerative colitis.    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  - Normal mucosa in the descending colon, in the transverse colon, in the ascending colon and in the cecum.    Recommendations  - Continue IV steroids over the weekend  - If continuing to improve, switch to PO prednisone 40 mg 7/15  - Start hydrocortisone enemas, can consider Rowasa enemas as well.  - Continue IV gancyclovir, switch to oral on discharge  - If she can be maintained on oral prednisone, will plan for initiation of tofacitanib as outpatient with Dr. Raphael Gibney. If symptoms flare again after switch to oral prednisone, will need to discuss surgical management.    Thank you for including [place] in the care of your patient. Please page the gastroenterology consult pager with any questions you may have.

## 2018-07-11 NOTE — Unmapped (Signed)
Inpatient Surgery Update      PRE-OP DOCUMENTATION: PROVIDER CERTIFICATION    I certify that the appropriate documentation of the patient's evaluation, assessment and treatment plan is found within the medical record and that the patient???s condition is unchanged from this earlier assessment.    SITE MARKING ATTESTATION    Site Marked: Not required    CONSENT FOR OPERATION OR PROCEDURE: PROVIDER CERTIFICATION    I hereby certify that the nature, purpose, benefits, usual and most frequent risks of, and alternatives to, the operation or procedure have been explained to the patient (or person authorized to sign for the patient) either by a physician or by the provider who is to perform the operation or procedure, that the patient has had an opportunity to ask questions, and that those questions have been answered. The patient or the patient's representatiive has been advised that the selected tasks may be performed by assistants to the primary health care provider(s). I believe that the patient (or person authorized to sign for the patient) understands what has been explained, and has consented to the operation or procedure.

## 2018-07-11 NOTE — Unmapped (Signed)
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 *        Pressure Ulcer(s)    Active Pressure Ulcer     None                 Brandy Moore is a 27 y.o. female who presented to [facility] with Ulcerative pancolitis without complication (CMS-HCC).      Brandy Moore is a 26 y.o. 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       Ulcerative colitis flare with superimposed CMV colitis: Initially treated with PO steroids, transitioned to IV steroids with significant symptom improvement, and attempted to transition back to PO steroids but symptoms returned. Colonoscopy today showed continuous and circumferential inflammation from rectum to sigmoid colon with normal mucosa in the rest of the colon.   - Continue to follow GI recs   - Continue IV ganciclovir, switch to oral on discharge or if unable to maintain IV access  - Start mesalamine enemas  - IV solumedrol 7/12 & 7/13 then switch to oral prednisone 40mg  7/14 if continues to improve. If symptoms flare again on PO steroids, will need to discuss surgical management.   - Pain control with acetaminophen 650mg  po q4h PRN mild pain + home oxycodone 5mg  once daily PRN.    Impaired glucose tolerance, steroid induced hyperglycemia: Hemoglobin A1C 6.1. Anticipate BG may worsening now that she is back on IV steroids.  In the past, she has been on metformin as an outpatient while receiving steroids. Will need PCP follow up.  - accuchecks and add insulin if indicated  - consider getting glucometer for discharge  - likely restart metformin at discharge if planning to continue prednisone      Prophylaxis:   - Lovenox 40mg  QD     ___________________________________________________________________    Subjective:  No acute events overnight. Abdominal pain continues with frequent stools. No other concerns at this time.    Labs/Studies:  Labs and Studies from the last 24hrs per EMR and Reviewed    Objective:  BP 116/58  - Pulse 58  - Temp 36.4 ??C (97.5 ??F) (Oral)  - Resp 16  - Ht 157.5 cm (5' 2)  - Wt 76.2 kg (168 lb)  - LMP 06/09/2018  - SpO2 100%  - BMI 30.73 kg/m??     General: well-appearing female in NAD, alert & oriented   HEENT: atraumatic, normocephalic, EOM grossly intact  Cardiovascular: RRR, no murmurs, rubs, or gallops  Respiratory: Lungs CTA, no increased work of breathing  GI/Abdomen: Bowel sounds present x 4 quadrants, no tenderness to palpation, no masses appreciated  Extremities: warm, dry, no cyanosis or pallor. No edema.   Psych: calm, cooperative, responds appropriately

## 2018-07-11 NOTE — Unmapped (Signed)
Diet changed to clear liquid, Patient tolerating diet well. Preparation for Colonoscopy initiated as ordered. Patient verbalized understanding of medical orders and plan of care.

## 2018-07-11 NOTE — Unmapped (Signed)
07/10/2018    [facility]  INPATIENT NOTE    Referring provider: Referred Self  No address on file    RE: Brandy Moore; DOB: 12-08-1991    HISTORY OF PRESENT ILLNESS:  27 y.o.??year old female??with left-sided ulcerative colitis on Remicade and azathioprine admitted for UC flare.   ??  INTERVAL EVENTS:  Patient reports recurrence of diarrhea and abdominal pain beginning last evening. No blood in her stool at this point, but now having 4-5 BMs in last 24 hours.    SUMMARY RECOMMENDATIONS:  1. She is not improving at this point, so will plan for colonoscopy 7/12  2. Restart solumedrol 20 mg IV TID  3. Place the patient on Moore liquid diet  4. Initiate GoLytely prep beginning at 6 PM  5. Make NPO PMN  5. Plan for colonoscopy on 7/12      Vito Backers, MD MSc  Gastroenterology Fellow PGY-7  [place] [facility] [place] [facility]    ------------------------------------------------------    Physical Exam:  Gen: NAD  HEENT: sclera anicteric  OP: MMM, Moore with no erythema or ulcers  CV: RRR  Resp: CTAB without respiratory distress  Abd: +BS, soft, moderate tenderness to palpation, no HSM  Ext: no peripheral edema  Neuro: grossly unremarkable  Skin: no abrasions, erythema or rashes appreciated

## 2018-07-11 NOTE — Unmapped (Signed)
VENOUS ACCESS ULTRASOUND PROCEDURE NOTE    Indications:   Poor venous access.    The Venous Access Team has assessed this patient for the placement of a PIV. Ultrasound guidance was necessary to obtain access.     Procedure Details:  Risks, benefits and alternatives discussed with patient. Identity of the patient was confirmed via name, medical record number and date of birth. The availability of the correct equipment was verified.    The vein was identified for ultrasound catheter insertion.  Field was prepared with necessary supplies and equipment.  Probe cover and sterile gel utilized.  Insertion site was prepped with chlorhexidine solution and allowed to dry.  The catheter extension was primed with normal saline.A(n) 20 g x 2.25 inch (this is a 29 day dwell, power injectable) catheter was placed in the left forearm with 1 attempt(s).     Catheter aspirated, 5 mL blood return present. The catheter was then flushed with 9 mL of normal saline. Insertion site cleansed, and dressing applied per manufacturer guidelines. The catheter was inserted without difficulty  by Thelma Barge RN.    The primary RN was notified.     Thank you,     Thelma Barge RN Venous Access Team   [phone]     Workup / Procedure Time:  30 minutes    See vein image below:

## 2018-07-11 NOTE — Unmapped (Signed)
A&O. VSS. Pt finishing GoLytely overnight. Stools getting clearer. Scheduled for colonoscopy today. Pt still receiving Ganciclovir IV; c/o severe pain while receiving med through PIV. Pt would really benefit from central line or midline if med is going to be long-term. Ondansetron given once for c/o nausea. No falls overnight. Will continue to monitor.    Problem: Adult Inpatient Plan of Care  Goal: Plan of Care Review  Outcome: Progressing  Goal: Patient-Specific Goal (Individualization)  Outcome: Progressing  Goal: Absence of Hospital-Acquired Illness or Injury  Outcome: Progressing  Goal: Optimal Comfort and Wellbeing  Outcome: Progressing  Goal: Readiness for Transition of Care  Outcome: Progressing  Goal: Rounds/Family Conference  Outcome: Progressing     Problem: Nausea and Vomiting  Goal: Fluid and Electrolyte Balance  Outcome: Progressing

## 2018-07-12 LAB — C-REACTIVE PROTEIN: C reactive protein:MCnc:Pt:Ser/Plas:Qn:: 5.4

## 2018-07-12 NOTE — Unmapped (Signed)
Arrived to room to observe staff rn successfully achieve IV access-vat rn appreciative of their efforts-Md  Paged  And notified of number of pivs since admission as well as to assess if pt can transition to po or picc.    Workup / Procedure Time:  15 minutes      The primary RN was notified.       Thank you,     Laurann Montana RN Venous Access Team

## 2018-07-12 NOTE — Unmapped (Signed)
Daily Progress Note    Assessment/Plan:    Principal Problem:    Ulcerative pancolitis without complication (CMS-HCC)  Resolved Problems:    * No resolved hospital problems. *        Pressure Ulcer(s)    Active Pressure Ulcer     None                 Brandy Moore is a 27 y.o. female who presented to [facility] with Ulcerative pancolitis without complication (CMS-HCC).    Ulcerative colitis flare with superimposed CMV colitis: Initially treated with PO steroids, transitioned to IV steroids with significant symptom improvement, and attempted to transition back to PO steroids but symptoms returned. Colonoscopy 7/12 showed continuous and circumferential inflammation from rectum to sigmoid colon with normal mucosa in the rest of the colon.   - Continue IV ganciclovir, switch to oral on discharge 900 mg bid.  - continue mesalamine enemas  - IV solumedrol 7/12 & 7/13 then switch to oral prednisone 40mg  on 7/14. If symptoms flare again on PO steroids, will need to discuss surgical management.   - Pain control with acetaminophen 650mg  po q4h PRN mild pain + home oxycodone 5mg  once daily PRN.    Impaired glucose tolerance, steroid induced hyperglycemia: Hemoglobin A1C 6.1. Anticipate BG may worsening now that she is back on steroids.  In the past, she has been on metformin as an outpatient while receiving steroids. Will need PCP follow up.  - accuchecks and add insulin if indicated  - consider getting glucometer for discharge  - likely restart metformin at discharge if planning to continue prednisone      Prophylaxis:   - Lovenox 40mg  QD  ________________________________________________________________    Subjective:  No acute events overnight.  She reports feeling much better and reports minimal BM that are non bloody.      Labs/Studies:  Recent Results (from the past 24 hour(s))   POCT Glucose    Collection Time: 07/11/18  4:59 PM   Result Value Ref Range    Glucose, POC 216 (H) 65 - 179 mg/dL   POCT Glucose Collection Time: 07/11/18  8:27 PM   Result Value Ref Range    Glucose, POC 288 (H) 65 - 179 mg/dL   POCT Glucose    Collection Time: 07/12/18  7:42 AM   Result Value Ref Range    Glucose, POC 125 65 - 179 mg/dL   POCT Glucose    Collection Time: 07/12/18 11:32 AM   Result Value Ref Range    Glucose, POC 127 65 - 179 mg/dL       Objective:  Temp:  [36.4 ??C (97.5 ??F)-37 ??C (98.6 ??F)] 37 ??C (98.6 ??F)  Heart Rate:  [60-88] 67  Resp:  [15-18] 18  BP: (101-117)/(48-72) 115/72  SpO2:  [98 %-100 %] 99 %    GEN: NAD, lying in bed  HEENT: EOMI/EOMI. MMM  CV: RRR  PULM: CTA B  ABD: soft, NT/ND, +BS  EXT: No edema  NEURO: alert and oriented x 4.  No focal neuro deficits.

## 2018-07-12 NOTE — Unmapped (Signed)
Pt alert and oriented x 4. Denies pain. Zofran given for nausea with relief. Colonoscopy done today. VSS. No acute events thus far today. Will continue to monitor.

## 2018-07-13 LAB — GLUCOSE RANDOM: Glucose:MCnc:Pt:Ser/Plas:Qn:: 142

## 2018-07-13 LAB — CBC
HEMATOCRIT: 31.1 % — ABNORMAL LOW (ref 36.0–46.0)
MEAN CORPUSCULAR HEMOGLOBIN CONC: 32.5 g/dL (ref 31.0–37.0)
MEAN CORPUSCULAR HEMOGLOBIN: 27.6 pg (ref 26.0–34.0)
MEAN CORPUSCULAR VOLUME: 84.8 fL (ref 80.0–100.0)
MEAN PLATELET VOLUME: 7.1 fL (ref 7.0–10.0)
RED BLOOD CELL COUNT: 3.66 10*12/L — ABNORMAL LOW (ref 4.00–5.20)
RED CELL DISTRIBUTION WIDTH: 15.2 % — ABNORMAL HIGH (ref 12.0–15.0)

## 2018-07-13 LAB — BASIC METABOLIC PANEL
ANION GAP: 8 mmol/L — ABNORMAL LOW (ref 9–15)
BLOOD UREA NITROGEN: 11 mg/dL (ref 7–21)
BUN / CREAT RATIO: 25
CALCIUM: 8.4 mg/dL — ABNORMAL LOW (ref 8.5–10.2)
CHLORIDE: 102 mmol/L (ref 98–107)
CO2: 26 mmol/L (ref 22.0–30.0)
CREATININE: 0.44 mg/dL — ABNORMAL LOW (ref 0.60–1.00)
EGFR CKD-EPI NON-AA FEMALE: >90 mL/min/{1.73_m2} (ref >=60)
GLUCOSE RANDOM: 142 mg/dL (ref 65–179)
POTASSIUM: 4.3 mmol/L (ref 3.5–5.0)
SODIUM: 136 mmol/L (ref 135–145)

## 2018-07-13 LAB — C-REACTIVE PROTEIN: C reactive protein:MCnc:Pt:Ser/Plas:Qn:: <5

## 2018-07-13 LAB — MEAN CORPUSCULAR VOLUME: Lab: 84.8

## 2018-07-13 NOTE — Unmapped (Signed)
A&O. VSS. Receiving Gancyclovir IV overnight; tolerating without issue. Pt denies having any more bloody stools. Team may convert meds to oral since pt has difficulty maintaining IV access and needing new PIV almost everyday. No c/o pain or nausea. No falls. Will continue to monitor.    Problem: Adult Inpatient Plan of Care  Goal: Plan of Care Review  Outcome: Progressing  Goal: Patient-Specific Goal (Individualization)  Outcome: Progressing  Goal: Absence of Hospital-Acquired Illness or Injury  Outcome: Progressing  Goal: Optimal Comfort and Wellbeing  Outcome: Progressing  Goal: Readiness for Transition of Care  Outcome: Progressing  Goal: Rounds/Family Conference  Outcome: Progressing     Problem: Nausea and Vomiting  Goal: Fluid and Electrolyte Balance  Outcome: Progressing

## 2018-07-13 NOTE — Unmapped (Signed)
VSS. Pain and slight edema developed at end of ganciclovir infusion. IV removed - see nursing note regarding this. Paged MD to advocate for CVAD or convert to oral medications as this is a daily problem for the patient, losing IV access due to infusion. Reports no abd pain or blood in stool this shift. Husband at the bedside. Verbalizing no needs at this time. Will CTM.

## 2018-07-13 NOTE — Unmapped (Signed)
VSS. No acute events this shift. Placed hat in toilet to catch stool per MD request. Visualized one small liquid stool, visually similar to tube feed. No obvious blood present. Less than 25ml in total. Husband remains at bedside. Pt verbalizing no pain this shift. States she is eating healthy foods that do not tend to irritate her stomach. No complaints at this time. Will CTM.

## 2018-07-13 NOTE — Unmapped (Signed)
Daily Progress Note    Assessment/Plan:    Principal Problem:    Ulcerative pancolitis without complication (CMS-HCC)  Resolved Problems:    * No resolved hospital problems. *        Pressure Ulcer(s)    Active Pressure Ulcer     None                 Brandy Moore is a 27 y.o. female who presented to [facility] with Ulcerative pancolitis without complication (CMS-HCC).    Ulcerative colitis flare with superimposed CMV colitis: Initially treated with PO steroids, transitioned to IV steroids with significant symptom improvement, and attempted to transition back to PO steroids but symptoms returned. Colonoscopy 7/12 showed continuous and circumferential inflammation from rectum to sigmoid colon with normal mucosa in the rest of the colon.   - discontinue IV ganciclovir and switch to oral valganciclovir 900 mg bid.  - continue mesalamine enemas  - start oral prednisone 40mg  daily.    - Pain control with acetaminophen 650mg  po q4h PRN mild pain + home oxycodone 5mg  once daily PRN.    Impaired glucose tolerance, steroid induced hyperglycemia: Hemoglobin A1C 6.1. Anticipate BG may worsening now that she is back on steroids.  In the past, she has been on metformin as an outpatient while receiving steroids. Will need PCP follow up.  - likely restart metformin at discharge if planning to continue prednisone      Prophylaxis:   - Lovenox 40mg  QD  ________________________________________________________________    Subjective:  No acute events overnight but had 3 bloody BM this morning.  No nausea or emesis.  No abd pain reported.      Labs/Studies:  Recent Results (from the past 24 hour(s))   POCT Glucose    Collection Time: 07/12/18  4:48 PM   Result Value Ref Range    Glucose, POC 265 (H) 65 - 179 mg/dL   C-reactive protein    Collection Time: 07/12/18 10:23 PM   Result Value Ref Range    CRP 5.4 <10.0 mg/L   C-reactive protein    Collection Time: 07/13/18  6:36 AM   Result Value Ref Range    CRP <5.0 <10.0 mg/L   Basic Metabolic Panel    Collection Time: 07/13/18  6:36 AM   Result Value Ref Range    Sodium 136 135 - 145 mmol/L    Potassium 4.3 3.5 - 5.0 mmol/L    Chloride 102 98 - 107 mmol/L    CO2 26.0 22.0 - 30.0 mmol/L    Anion Gap 8 (L) 9 - 15 mmol/L    BUN 11 7 - 21 mg/dL    Creatinine 1.61 (L) 0.60 - 1.00 mg/dL    BUN/Creatinine Ratio 25     EGFR CKD-EPI Non-African American, Female >90 >=60 mL/min/1.66m2    EGFR CKD-EPI African American, Female >90 >=60 mL/min/1.18m2    Glucose 142 65 - 179 mg/dL    Calcium 8.4 (L) 8.5 - 10.2 mg/dL   CBC    Collection Time: 07/13/18  6:36 AM   Result Value Ref Range    WBC 7.7 4.5 - 11.0 10*9/L    RBC 3.66 (L) 4.00 - 5.20 10*12/L    HGB 10.1 (L) 12.0 - 16.0 g/dL    HCT 09.6 (L) 04.5 - 46.0 %    MCV 84.8 80.0 - 100.0 fL    MCH 27.6 26.0 - 34.0 pg    MCHC 32.5 31.0 - 37.0 g/dL  RDW 15.2 (H) 12.0 - 15.0 %    MPV 7.1 7.0 - 10.0 fL    Platelet 544 (H) 150 - 440 10*9/L   POCT Glucose    Collection Time: 07/13/18  8:07 AM   Result Value Ref Range    Glucose, POC 196 (H) 65 - 179 mg/dL   POCT Glucose    Collection Time: 07/13/18 11:37 AM   Result Value Ref Range    Glucose, POC 216 (H) 65 - 179 mg/dL       Objective:  Temp:  [36.5 ??C (97.7 ??F)-36.8 ??C (98.2 ??F)] 36.6 ??C (97.9 ??F)  Heart Rate:  [54-82] 62  Resp:  [16-18] 16  BP: (109-125)/(51-65) 112/54  SpO2:  [98 %-100 %] 100 %    GEN: NAD, lying in bed  HEENT: EOMI/EOMI. MMM  CV: RRR  PULM: CTA B  ABD: soft, NT/ND, +BS  EXT: No edema  NEURO: alert and oriented x 4.  No focal neuro deficits.

## 2018-07-14 LAB — C-REACTIVE PROTEIN: C reactive protein:MCnc:Pt:Ser/Plas:Qn:: <5

## 2018-07-14 MED ORDER — METFORMIN 1,000 MG TABLET: 500 mg | tablet | Freq: Every day | 0 refills | 0 days | Status: AC

## 2018-07-14 MED ORDER — VALGANCICLOVIR 450 MG TABLET
ORAL_TABLET | Freq: Two times a day (BID) | ORAL | 0 refills | 0.00000 days | Status: CP
Start: 2018-07-14 — End: 2018-07-14

## 2018-07-14 MED ORDER — VALGANCICLOVIR 450 MG TABLET: tablet | 0 refills | 0 days

## 2018-07-14 MED ORDER — MESALAMINE 4 GRAM/60 ML ENEMA
Freq: Two times a day (BID) | RECTAL | 0 refills | 0.00000 days | Status: CP
Start: 2018-07-14 — End: 2018-07-14

## 2018-07-14 MED ORDER — ACETAMINOPHEN 325 MG TABLET
ORAL | 0 refills | 0.00000 days | PRN
Start: 2018-07-14 — End: ?

## 2018-07-14 MED ORDER — PREDNISONE 20 MG TABLET
ORAL_TABLET | 0 refills | 0 days
Start: 2018-07-14 — End: 2018-07-14

## 2018-07-14 MED ORDER — METFORMIN 1,000 MG TABLET
ORAL_TABLET | Freq: Every day | ORAL | 0 refills | 0.00000 days | Status: CP
Start: 2018-07-14 — End: 2018-07-14

## 2018-07-14 MED ORDER — MESALAMINE 4 GRAM/60 ML ENEMA: mL | 0 refills | 0 days

## 2018-07-14 MED FILL — MESALAMINE/4GM/ENE: MESALAMINE/4GM/ENE | 14 days supply | Qty: 4 | Fill #0

## 2018-07-14 MED FILL — PREDNISONE/20MG/TAB: PREDNISONE/20MG/TAB | 27 days supply | Qty: 54 | Fill #0

## 2018-07-14 MED FILL — METFORMIN HCL/1000MG/TABS: METFORMIN HCL/1000MG/TABS | 30 days supply | Qty: 15 | Fill #0

## 2018-07-14 MED FILL — VALGANCICLOVIR/450MG/TABS: VALGANCICLOVIR/450MG/TABS | 27 days supply | Qty: 108 | Fill #0

## 2018-07-14 NOTE — Unmapped (Addendum)
RESUMEN DE LA VISITA  Devona Konig Ernest Haber N??m. de expediente: [id number]    07/02/2018 -  / 4 ONC [facility]  [address]  [place] [address]  Loc: [phone]       Los siguientes pasos  --------- Hacer----------  ? Recoja estos medicamentos en [place] [facility] [place] - [address]   ?? mesalamine   ?? metFORMIN   ?? predniSONE   ?? valGANciclovir     ? Recoja estos medicamentos en cualquier farmacia con su receta impresa  ?? acetaminophen    ---------- Asistir---------  23 de julio  INFUSION - REMICADE 180 10:45 AM   llegue a las 10:30 AM   [facility] [address] [place]   [address] [place] [address]   [phone]      Tiene m??s citas el mismo d??a.  Revise la lista completa de citas.  _________________________________________________________  Instrucciones   Sus medicamentos han cambiado  EMPIECE a usar:   acetaminophen??(TYLENOL)??    mesalamine??(ROWASA)??    tofacitinib??(XELJANZ)??    valGANciclovir??(VALCYTE)??     CAMBIE la manera de usar:   metFORMIN??(GLUCOPHAGE)??    predniSONE??(DELTASONE    DEJE de usar:   azaTHIOprine??50 mg tablet??(IMURAN)??    hydrocortisone??100 mg/60 mL enema??(CORTENEMA    Revise la lista de medicamentos actualizada a continuaci??n.     Otras instrucciones  Llamar al m??dico si tiene: fiebre, empeoramiento de dolor abdominal, n??useas o v??mitos y Engineer, structural de [place] en las heces  Llamar al m??dico si tiene dificultad para respirar, dolor de cabeza o trastornos visuales  Llamar al m??dico si tiene cansancio extremo  Llamar al m??dico si tiene urticaria  Llamar al m??dico si tiene mareos o sensaci??n de Advice worker al m??dico si tiene n??useas o v??mitos constantes  Llamar al m??dico si tiene dolor intenso incontrolable  Llamar al m??dico si tiene temperatura de m??s de 38.5 grados Celsius  Llamar al m??dico si tiene temperatura de m??s de 101.3 grados Fahrenheit    Instrucciones de alta  Haga seguimiento con GI seg??n lo programado      Seguimiento  23 de julio INFUSION - REMICADE 180   martes Jul 22, 2018 10:45 AM   (llegue a las 10:30 AM)    [facility] THERAPE[facility]IC INFUSION CTR [address] [place]   [address] [place] [address]   [phone]    9 de agosto RETURN IBD con Zetta Bills, MD   viernes 9 de agosto de 2019 a las 9:00 AM (llegue a las 8:30 AM)    [facility] [place]   [address]  [place] [address]   [phone]      Motivo de la hospitalizaci??n  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    Vaya a [email or web address] y haga clic en ??Activate Your Account??. [place] su c??digo de activaci??n de My [place] Chart exactamente como aparece a continuaci??n junto con su fecha de  nacimiento para completar el proceso de activaci??n.     Mi c??digo de activaci??n de My [place] Chart: No se gener?? un c??digo de activaci??n  [place] actual de MyChart: activo    Si necesita ayuda con My [place] Chart, llame a [facility] al [phone].    Care Everywhere CEID  [phone]: Este n??mero de identificaci??n se puede usar si otra instalaci??n m??dica que [employer] el programa Epic necesita solicitar el expediente m??dico de [place].      Lista de medicamentos      Por la ma??ana Por la tarde Por la noche A la hora de irse a dormir Cuando sea necesario   EMPIECE  acetaminophen 325 MG tablet   Com??nmente conocido como:   TYLENOL   Tome 2 tabletas (650 mg total) por v??a oral cada cuatro (4) horas seg??n lo necesite.               cholecalciferol (vitamin D3) 50,000 unit capsule   Tome 50,000 Unidades por v??a oral una vez a la semana por 8 semanas.          EMPIECE  mesalamine 4 gram/60 mL enema   Com??nmente conocido como: ROWASA  Introduzca 60 mL (4 g total) en el recto Dos (2) veces al d??a.  Se le administr?? por ??ltima vez:   4 g el 07/14/2018 a las 8:49 AM                CAMBIE  metFORMIN  Tableta de 1,000 MG  Com??nmente conocido como: GLUCOPHAGE  Tome 0.5 tabletas (500 mg total) por v??a oral diario.  Lo que cambi??:  -Cu??nto tomar  -Cu??ndo tomar esto          oxyCODONE 5 mg capsule   Com??nmente conocido como: OXY-IR  Tome 5 mg por via oral diario segun lo necesite para el dolor.  Se le administr?? por ??ltima vez:   pregunte a su doctor o enfermera.          CAMBIE  predniSONE 20 MG tablet   Com??nmente conocido como: DELTASONE  Empiece a tomar el: 07/15/2018  Tome 2 tabletas (40 mg total) por via oral diario. por 27 dias.   Se le administr?? por ??ltima vez: 40 mg el 07/14/2018 a las??8:49 AM   Lo que cambi??:  -potencia del medicamento  -Cu??nto tomar  -Cu??ndo tomar esto          EMPIECE  tofacitinib 10 mg Tab   Com??nmente conocido como: XELJANZ  Tome 10 mg por v??a oral Dos (2) veces al dia.                EMPIECE  valGANciclovir 450 mg tablet   Com??nmente conocido como: VALCYTE  Tome 2 tabletas (900 mg total) por via oral Dos (2 veces al dia. por 27 dias.  Se le administr?? por ??ltima vez:   900 mg el 07/14/2018 a las??8:49 AM                  Donde recoger los medicamentos   Recoja estos medicamentos en [place] [facility] [place] - [address]   ?? mesalamine ??? metFORMIN ??? predniSONE ??? valGANciclovir     Direcci??n: [address], [place] [address]   Tel??fono: [phone]      Recoja estos medicamentos en cualquier farmacia  No necesita receta  ?? acetaminophen 325 MG tablet      Estos medicamentos se le mandaran por correo  ??  tofacitinib    De:  [facility] [place], [place] - [address] telefono: [phone]      Informaci??n adjunta Informaci??n de recursos ante una crisis:  L??neas directas nacionales de prevenci??n del suicidio:  1-800-SUICIDE 445-302-2591 en espa??ol o 1-800-273-TALK [phone]) en ingl??s    L??neas de atenci??n ante Neomia Dear crisis de [place]:   [phone]        Audie Clear N??m. de expediente: [phone]     CSN: [id number]   SA: [facility] Report:-IP After Visit Summary      Al Sallye Ober, yo reconozco que recib?? y entiendo las instrucciones del alta precedentes y materiales educativos para el paciente adjuntos (si los hay).   By signing below, I acknowledge that I have received and understand the foregoing discharge instructions and accompanying patient education materials (if any).      ____________________________________________________________________  Chales Salmon del paciente/representante autorizado/adulto responsable  Signature of Patient/Authorized Representative/Responsible Adult      ____________________________________________________________________  Nemiah Commander de imprenta y relaci??n con el paciente:    Printed Name and Relationship to Patient      Franco Nones y hora: _________________________________________________________  Date and Time        _____________________________________________________________________  Sherald Hess la enfermera u otro proveedor:   Signature of Nurse or Other Provider      _____________________________________________________________________  Nemiah Commander de imprenta y credenciales:    Printed Name and Credentials      Fecha y hora: __________________________________________________________  Date and Time

## 2018-07-14 NOTE — Unmapped (Signed)
PA denied for Brandy Moore, awaiting appeal.

## 2018-07-14 NOTE — Unmapped (Signed)
Pt alert and oriented. Afebrile. VSS. Spanish speaking with good understanding of English. Continues to have soft/loose stools that have had bright red blood in stool. Other half at bedside. Safety precautions maintained. Will continue to monitor.

## 2018-07-15 MED ORDER — PREDNISONE 20 MG TABLET
ORAL_TABLET | Freq: Every day | ORAL | 0 refills | 0 days | Status: CP
Start: 2018-07-15 — End: 2018-07-30

## 2018-07-28 NOTE — Unmapped (Signed)
Appeal sent for Brandy Moore to [facility] today 07/28/18. [employer] requests appeal submission date and next app for xeljanz interim care. Sent fax to [employer] with this information

## 2018-07-30 MED ORDER — PREDNISONE 20 MG TABLET
ORAL_TABLET | 0 refills | 0 days | Status: CP
Start: 2018-07-30 — End: ?

## 2018-08-04 NOTE — Unmapped (Signed)
[facility] Specialty Medication Referral: PA approved, low copay    Medication (Brand/Generic): Xeljanz 10mg     Initial FSI Test Claim completed with resulted information below:  No PA required  Patient ABLE to fill at [facility]  Insurance Company:  [employer]  Anticipated Copay: $0    As Co-pay is under $100 defined limit, per policy there will be no further investigation of need for financial assistance at this time unless patient requests. This referral has been communicated to the provider and handed off to the [facility] team for further processing and filling of prescribed medication.   ______________________________________________________________________  Please utilize this referral for viewing purposes as it will serve as the central location for all relevant documentation and updates.

## 2018-08-07 MED FILL — XELJANZ/10MG/TABS: XELJANZ/10MG/TABS | 30 days supply | Qty: 60 | Fill #0

## 2018-08-07 NOTE — Unmapped (Signed)
[facility]   Patient Onboarding/Medication Counseling    [name] is a 27 y.o. 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    Medication: Harriette Ohara 10mg     Verified patient's date of birth / HIPAA.      Education Provided: ??    Dose/Administration discussed: Take 1 tablets by mouth twice daily. This medication should be taken  without regard to food.  Stressed the importance of taking medication as prescribed and to contact provider if that changes at any time.  Discussed missed dose instructions.    Storage requirements: this medicine should be stored at room temperature.     Side effects / precautions discussed: Discussed common side effects, including but not limited to headache, diarrhea, runny nose, etc.. If patient experiences signs/sympoms of an allergic reaction (hives/rash/itching, red/swollen/blistered/peeling skin, wheezing, tightness in chest/throat, difficultly breathing/swallowing/talking, unusual hoarseness, swelling of mouth/face/lips/tongue/throat, etc.), s/sx of livery dysfunction, s/sx of infection, skin changes, change in bowel habits, sob, change in heartbeat, etc., they need to call the doctor.  Patient will receive a drug information handout with shipment.    Handling precautions / disposal reviewed:  n/a.    Drug Interactions: other medications reviewed and up to date in Epic.  No drug interactions identified.    Comorbidities/Allergies: reviewed and up to date in Epic.    Verified therapy is appropriate and should continue      Delivery Information    Medication Assistance provided: Prior Authorization    Anticipated copay of $0 reviewed with patient. Verified delivery address in FSI and reviewed medication storage requirement.    Scheduled delivery date: 08/08/18    Explained that we ship using UPS or courier and this shipment will not require a signature.      Explained the services we provide at [facility] and that each month we would call to set up refills.  Stressed importance of returning phone calls so that we could ensure they receive their medications in time each month.  Informed patient that we should be setting up refills 7-10 days prior to when they will run out of medication.  Informed patient that welcome packet will be sent.      Patient verbalized understanding of the above information as well as how to contact the pharmacy at [phone] option 4 with any questions/concerns.  The pharmacy is open Monday through Friday 8:30am-4:30pm.  A pharmacist is available 24/7 via pager to answer any clinical questions they may have.        Patient Specific Needs      ? Patient has no physical, cognitive, or cultural barriers.    ? Patient prefers to have medications discussed with  Patient     ? Patient is able to read and understand education materials at a high school level or above.    ? Patient's primary language is  Spanish           Lupita Shutter  [facility] Specialty Pharmacist

## 2018-08-08 ENCOUNTER — Ambulatory Visit: Admit: 2018-08-08 | Discharge: 2018-08-09 | Payer: MEDICAID

## 2018-08-08 DIAGNOSIS — K5100 Ulcerative (chronic) pancolitis without complications: Principal | ICD-10-CM

## 2018-08-08 DIAGNOSIS — E119 Type 2 diabetes mellitus without complications: Secondary | ICD-10-CM

## 2018-08-08 DIAGNOSIS — D500 Iron deficiency anemia secondary to blood loss (chronic): Secondary | ICD-10-CM

## 2018-08-08 LAB — FERRITIN: Ferritin:MCnc:Pt:Ser/Plas:Qn:: 4.9

## 2018-08-08 LAB — IRON & TIBC
IRON SATURATION (CALC): 10 % — ABNORMAL LOW (ref 15–50)
TOTAL IRON BINDING CAPACITY (CALC): 338.9 mg/dL (ref 252.0–479.0)

## 2018-08-08 LAB — COMPREHENSIVE METABOLIC PANEL
ALBUMIN: 3.6 g/dL (ref 3.5–5.0)
ALKALINE PHOSPHATASE: 72 U/L (ref 38–126)
ALT (SGPT): 43 U/L (ref 15–48)
ANION GAP: 4 mmol/L — ABNORMAL LOW (ref 9–15)
AST (SGOT): 17 U/L (ref 14–38)
BILIRUBIN TOTAL: 0.3 mg/dL (ref 0.0–1.2)
BUN / CREAT RATIO: 20
CALCIUM: 8.8 mg/dL (ref 8.5–10.2)
CHLORIDE: 102 mmol/L (ref 98–107)
CO2: 29 mmol/L (ref 22.0–30.0)
CREATININE: 0.51 mg/dL — ABNORMAL LOW (ref 0.60–1.00)
EGFR CKD-EPI AA FEMALE: >90 mL/min/{1.73_m2} (ref >=60)
EGFR CKD-EPI NON-AA FEMALE: >90 mL/min/{1.73_m2} (ref >=60)
GLUCOSE RANDOM: 164 mg/dL (ref 65–179)
SODIUM: 135 mmol/L (ref 135–145)

## 2018-08-08 LAB — CBC W/ AUTO DIFF
BASOPHILS ABSOLUTE COUNT: 0 10*9/L (ref 0.0–0.1)
EOSINOPHILS ABSOLUTE COUNT: 0 10*9/L (ref 0.0–0.4)
EOSINOPHILS RELATIVE PERCENT: 0.3 %
HEMATOCRIT: 32.8 % — ABNORMAL LOW (ref 36.0–46.0)
HEMOGLOBIN: 10.7 g/dL — ABNORMAL LOW (ref 12.0–16.0)
LARGE UNSTAINED CELLS: 1 % (ref 0–4)
LYMPHOCYTES ABSOLUTE COUNT: 4.5 10*9/L (ref 1.5–5.0)
LYMPHOCYTES RELATIVE PERCENT: 39.2 %
MEAN CORPUSCULAR HEMOGLOBIN CONC: 32.7 g/dL (ref 31.0–37.0)
MEAN CORPUSCULAR HEMOGLOBIN: 27.6 pg (ref 26.0–34.0)
MEAN CORPUSCULAR VOLUME: 84.6 fL (ref 80.0–100.0)
MEAN PLATELET VOLUME: 7.5 fL (ref 7.0–10.0)
MONOCYTES ABSOLUTE COUNT: 0.3 10*9/L (ref 0.2–0.8)
MONOCYTES RELATIVE PERCENT: 2.5 %
NEUTROPHILS ABSOLUTE COUNT: 6.5 10*9/L (ref 2.0–7.5)
NEUTROPHILS RELATIVE PERCENT: 56.8 %
PLATELET COUNT: 523 10*9/L — ABNORMAL HIGH (ref 150–440)
RED BLOOD CELL COUNT: 3.87 10*12/L — ABNORMAL LOW (ref 4.00–5.20)
RED CELL DISTRIBUTION WIDTH: 16.1 % — ABNORMAL HIGH (ref 12.0–15.0)
WBC ADJUSTED: 11.5 10*9/L — ABNORMAL HIGH (ref 4.5–11.0)

## 2018-08-08 LAB — SMEAR REVIEW

## 2018-08-08 LAB — SODIUM: Sodium:SCnc:Pt:Ser/Plas:Qn:: 135

## 2018-08-08 LAB — MEAN CORPUSCULAR HEMOGLOBIN: Lab: 27.6

## 2018-08-08 LAB — C-REACTIVE PROTEIN: C reactive protein:MCnc:Pt:Ser/Plas:Qn:: <5

## 2018-08-08 LAB — ERYTHROCYTE SEDIMENTATION RATE: Lab: 5

## 2018-08-08 LAB — TOTAL IRON BINDING CAPACITY (CALC): Lab: 338.9

## 2018-08-08 MED ORDER — METFORMIN 500 MG TABLET
ORAL_TABLET | Freq: Two times a day (BID) | ORAL | 1 refills | 0.00000 days | Status: CP
Start: 2018-08-08 — End: 2019-08-08

## 2018-08-08 MED ORDER — METFORMIN 500 MG TABLET: tablet | 1 refills | 0 days

## 2018-08-08 MED FILL — METFORMIN HCL/500MG/TABS: METFORMIN HCL/500MG/TABS | 30 days supply | Qty: 60 | Fill #0

## 2018-08-08 NOTE — Unmapped (Addendum)
INSTRUCTIONS:     1.  Continue Xeljanz 10mg  twice daily  2.  Update lab work today  3.  Finish course of Valganciclovir  4.  Recommend speeding up the prednisone taper:  50mg  for 5 days, 40mg  for 5 days, 30mg  for 5 days, 20mg  for 5 days, then 15mg  for 5 days, then 10mg  for 5 days, then 5mg  for 5 days, then stop.   5.  Follow up in ~ 2months.   6.  Please call [employer] to check on the insurance status  7.  Recommend taking at least 1000mg  of calcium AND 600 IU of Vitamin D daily (in divided doses).    8.  Please continue to take Metformin for your diabetes  9.  If any trouble or symptoms, do not hesitate to call.     Zetta Bills, MD  Assistant Professor of Medicine  Division of Gastroenterology & Hepatology  [facility] [place]    Clinical Nurse Contact:  Neta Mends, RN  Ph#  [phone]  Fax#  [phone]    Office Number Rubbie Battiest):  Ph# [phone]  Fax # [phone]    For clinic appointments, please call, [phone], option 1.  For questions regarding radiology appointments, or to schedule, [phone].  For questions regarding scheduling GI procedures (e.g,. Colonoscopy), please call, [phone], option 2.    For educational material and resources:  [email or web address]  ============================================

## 2018-08-08 NOTE — Unmapped (Signed)
[facility]   FOLLOW UP CONSULT NOTE - INFLAMMATORY BOWEL DISEASE  08/08/2018    Demographics:  Brandy Moore is a 27 y.o. year old female    Diagnosis:  Ulcerative Colitis  Disease onset (yr):  2008  Age at onset:   82-40 yr old (A2)  Location:  Extensive (E3)  Behavior:  Colitis  Current Tight Control Scenario:   Workup    Referring physician:   Janith Lima, MD  [phone]  [facility]  [place], [place] [address]          HPI / NOTE :     The entire visit today was conducted with assistance of in person spanish interpreter.     HPI:  Interval Events:   1.  Feels remicade didn't help her at all, worsening symptoms, hence got admitted to hospital for UC flare - 7 grossly bloody BMs/day, abdominal cramping.  I reviewed all records.  Sigmoidoscopy showed progression of her disease despite having gotten 4 doses of remicade.  Had slow improvement on IV steroids in hospital, wanted to avoid colectomy.  Hence, discharged home on prednisone 40mg  and plans to start Tofacitinib.  She also had CMV and was discharged on a course of valganciclovir.  2.  She got confused regarding the prednisone instructions and accidentally took double the prescribed dose (80mg  daily).  She called and spoke with our IBD nurse Neta Mends, and we recommended a modified taper given this error  3.  She is noticed extensive rash in her upper chest since being on high-dose prednisone.  This is stabilized as she is tapered.  Saw her PCP who gave her a course of Keflex.  4. She is currently on prednisone 50 mg daily and is tapering as prescribed.  5. Got Tofacitinib ~1 week ago, taking 10mg  twice daily.    6.  We called [facility] pharmacy and apparently her Tofacitinib is approved through her [employer].   7.  There is considerable confusion regarding her [employer].  We discussed this at length today.  8.  She has been out of her diabetes medications for almost 2 weeks.  She was proved to take metformin.  She does not have a glucometer at home to measure her blood sugars.  She feels no symptoms of hypoglycemia.  9.  Reports her weight is down overall but has stabilized and increased slightly since the hospitalization.  10.  Bowel symptoms are doing well.  She feels considerable improvement with resolution of abdominal pain and bowel movements are decreasing.  Urgency is largely improved.    Abdominal pain (0-10): 0  BM a day: 3-4x/day (improved - was 7+ prior to going to the hospital)  Consistency: mostly formed  % of stools have blood: 0  Nocturnal BM: no  Urgency:  no  Weight change over last 6 mo: stable  Smoking: no  NSAIDS: avoids    Review of Systems:   Review of systems positive for: negative except as above.   Otherwise, the balance of 10 systems is negative.          IBD HISTORY:     Brief IBD Disease Course:    - 2008 - onset of hematochezia, abdominal pain (after birth of her daughter).  Told she had hemorrhoids, but did not improve.  No insurance and no consistent care for next 5 years.   - 2012 - saw GI, colonoscopy showed ulcerative colitis (?left sided).  Treated with suppository only for several years with only partial benefit.   - 2017 - moved to [place]. Colonoscopy showed ?moderate disease, was quite anemic and lost weight.  Recommended to get Remicade but no insurance so could not get it.  Hence, just on prednisone on and off for next year and a half.    - 2018 - worsening of colitis, anemia with Hgb 9, admitted to local hospital for PRBC tx.  Given oral mesalamine and prednisone, again could not get remicade.  Seen at [facility], started Azathioprine + mesalamine.  Did well for few months, but developed colitis flare by Feb 2019.  Colonoscopy with moderate left sided colitis and labs showing some degree of shunt metabolism with azathioprine.     Endoscopy:      - Colonoscopy 01/30/16 - report not available   PATH = mild to moderate chronic active colitis throughout the colon - Colonoscopy 02/19/18 - moderate colitis up to 30cm (Mayo 2), Mild colitis from sigmoid to distal transverse colon (Mayo 1), normal right colon and ileum.    PATH = moderate chronic active colitis  - Sigmoidoscopy 07/02/18 (after 4 doses of remicade):  Severe colitis from rectum through transverse colon - worse than prior (?Mayo 3).    PATH = severe chronic active colitis  - Colonoscopy 07/15/18 (after 10 days of IV steroids) - moderate colitis (Mayo 2) from rectum through sigmoid, improved    Imaging:    - MRI Abdomen 10/27/15 - slight fullness of pancreatic head with minimal pancreatic and peripancreatic edema, suspect mild acute pancreatitis.  Enlarged liver (~20cm) with fatty infiltration. No biliary ductal dilation or acute choleycystitis.   - CT A/P 12/09/15 - focal steatosis. Prominent but nonspecific mesenteric lymph nodes. No acute or inflammatory process in the abdomen/pelvis.     Prior IBD medications (type, dose, duration, response):  x 5-ASAs - Pentasa, Delzicol  x Oral corticosteroids - extensive prednisone use  ? Intravenous corticosteroids  ? Antibiotics  x Thiopurines - started July 2018 - Azathioprine - 6TGN 47, 2049   TPMT status - normal (07/23/17)  ? Methotrexate  x Anti-TNF therapies - Remicade march 2019 - primary non-response with disease progression and hospitalization after 4 doses (3 loading doses at 5mg /kg and then 1 dose at 10mg /kg)  ? Anti-Integrin therapies  ? Anti-Interleukin therapies  x Anti-JAK therapies - Tofacitinib started 07/30/18  ? Cyclosporine  ? Clinical trial medication  ? Other (Please specify):    IBD health maintenance:   Influenza vaccine: 10/01/17  Pneumonia vaccine:  Prevnar 01/07/18, Pneumovax 03/25/18  Hepatitis B: sAg neg 07/23/17  TB testing: Quantiferon neg 07/23/17  Chickenpox/Shingles history:   Bone denistometry:   Derm appointment:  Last small bowel imaging:   Last colonoscopy:   PAP smear:     Extraintestinal manifestations:   -joint pains affecting: n  -eye: n -skin: n  -oral ulcers :  n  -blood clots: n  -PSC: n  -other: n          Past Medical History:   Past medical history:   Past Medical History:   Diagnosis Date   ??? Anemia    ??? Breast cyst    ??? Bulimia nervosa 02/12/2017   ??? Diabetes (CMS-HCC) 2016   ??? Hyperglycemia    ??? Pancreatitis 10/09/2015    3 mo after birth of daughter   ??? Ulcerative colitis (CMS-HCC) 2007     Past surgical history:   Past Surgical History:   Procedure Laterality  Date   ??? PR COLONOSCOPY FLX DX W/COLLJ SPEC WHEN PFRMD N/A 07/11/2018    Procedure: COLONOSCOPY, FLEXIBLE, PROXIMAL TO SPLENIC FLEXURE; DIAGNOSTIC, W/WO COLLECTION SPECIMEN BY BRUSH OR WASH;  Surgeon: Neysa Hotter, MD;  [facility];  Service: Gastroenterology   ??? PR COLONOSCOPY W/BIOPSY SINGLE/MULTIPLE N/A 02/19/2018    Procedure: COLONOSCOPY, FLEXIBLE, PROXIMAL TO SPLENIC FLEXURE; WITH BIOPSY, SINGLE OR MULTIPLE;  Surgeon: Zetta Bills, MD;  Location: [facility];  Service: Gastroenterology   ??? PR SIGMOIDOSCOPY,BIOPSY N/A 07/02/2018    Procedure: SIGMOIDOSCOPY, FLEXIBLE; WITH BIOPSY, SINGLE OR MULTIPLE;  Surgeon: Charm Rings, MD;  Location: [facility];  Service: Gastroenterology     Family history:   Family History   Problem Relation Age of Onset   ??? Hypertension Paternal Grandmother    ??? Crohn's disease Neg Hx    ??? Colorectal Cancer Neg Hx    ??? Ulcerative colitis Neg Hx    ??? Liver cancer Neg Hx    ??? Pancreatic cancer Neg Hx    ??? Pancreatitis Neg Hx      Social history:   Social History     Socioeconomic History   ??? Marital status: Single     Spouse name: Not on file   ??? Number of children: Not on file   ??? Years of education: Not on file   ??? Highest education level: Not on file   Occupational History   ??? Not on file   Social Needs   ??? Financial resource strain: Not on file   ??? Food insecurity:     Worry: Not on file     Inability: Not on file   ??? Transportation needs:     Medical: Not on file     Non-medical: Not on file   Tobacco Use   ??? Smoking status: Never Smoker   ??? Smokeless tobacco: Never Used   Substance and Sexual Activity   ??? Alcohol use: No   ??? Drug use: No   ??? Sexual activity: Not on file   Lifestyle   ??? Physical activity:     Days per week: Not on file     Minutes per session: Not on file   ??? Stress: Not on file   Relationships   ??? Social connections:     Talks on phone: Not on file     Gets together: Not on file     Attends religious service: Not on file     Active member of club or organization: Not on file     Attends meetings of clubs or organizations: Not on file     Relationship status: Not on file   Other Topics Concern   ??? Not on file   Social History Narrative   ??? Not on file             Allergies:     Allergies   Allergen Reactions   ??? Ibuprofen      Causes bleeding (GI)  bleeding               Medications:     Current Outpatient Medications   Medication Sig Dispense Refill   ??? cephalexin (KEFLEX) 500 MG capsule Take 500 mg by mouth Four (4) times a day.     ??? predniSONE (DELTASONE) 20 MG tablet 60mg  for 7 days, then decrease by 10mg  every 7 days until 20mg . 70 tablet 0   ??? tofacitinib 10 mg Tab  TAKE 1 TABLET BY MOUTH TWICE DAILY ( TOME 1 TABLETA VIA ORAL DOS VECES AL DIA ) 60 tablet 0   ??? valGANciclovir (VALCYTE) 450 mg tablet TAKE 2 TABLETS BY MOUTH TWICE DAILY ( TOME 2 TABLETAS DOS VECES AL DIA ) 108 tablet 0   ??? acetaminophen (TYLENOL) 325 MG tablet Take 2 tablets (650 mg total) by mouth every four (4) hours as needed. (Patient not taking: Reported on 08/08/2018)  0   ??? azaTHIOprine (IMURAN) 50 mg tablet TAKE 3 & 1/2 TABLETS (175MG ) BY MOUTH ONCE DAILY ( TOME 3 Y 1/2 TABLETAS VIA ORAL UNA VEZ AL DIA ) (Patient not taking: Reported on 08/08/2018) 49 tablet 3   ??? cholecalciferol, vitamin D3, 50,000 unit capsule Take 50,000 Units by mouth once a week. For 8 weeks. (Patient not taking: Reported on 03/25/2018) 8 capsule 0   ??? lisinopril (PRINIVIL,ZESTRIL) 10 MG tablet TEST (Patient not taking: Reported on 08/08/2018) 30 tablet 0   ??? mesalamine (ROWASA) 4 gram/60 mL enema INSERT 60 ML IN THE RECTUM TWICE DAILY ( INTRODUCIR 60 ML EN EL RECTO DOS VECES AL DIA ) (Patient not taking: Reported on 08/08/2018) 1680 mL 0   ??? metFORMIN (GLUCOPHAGE) 1000 MG tablet TAKE 1/2 TABLET BY MOUTH ONCE DAILY ( TOMAR 1/2 TABLETA VIA ORAL UNA VEZ AL DIA ) (Patient not taking: Reported on 08/08/2018) 15 each 0   ??? metFORMIN (GLUCOPHAGE) 500 MG tablet Take 1 tablet (500 mg total) by mouth 2 (two) times a day with meals. 60 tablet 1   ??? oxyCODONE (OXY-IR) 5 mg capsule Take 5 mg by mouth daily as needed for pain.       No current facility-administered medications for this visit.              Physical Exam:   BP 119/65  - Pulse 57  - Temp 36.6 ??C (97.8 ??F) (Temporal)  - Wt 76.7 kg (169 lb)  - BMI 30.91 kg/m??     GEN: young female in no apparent distress, appears comfortable on exam  HEENT: PEERL, OP Moore with no erythema, lesions, exudate, mucous membranes moist  NECK: Supple, no lymphadenopathy  LUNGS: CTAB, no wheezes, rales, or rhonchi  CV: S1/S2, RRR, no murmurs  ABD: Soft, nontender, nondistended, normoactive bowel sounds, no rebound/guarding, no appreciable organomegaly  Extremities: no cyanosis, clubbing or edema, normal gait  Psych: affect appropriate, A&O x3  SKIN: extensive erythematous papular rash across upper chest, shoulders and upper arms          Labs, Data & Indices:     Lab Review:   Lab Results   Component Value Date    WBC 7.7 07/13/2018    RBC 3.66 (L) 07/13/2018    HGB 10.1 (L) 07/13/2018     Lab Results   Component Value Date    AST 17 07/02/2018    ALT 9 (L) 07/02/2018    BUN 11 07/13/2018    CREATININE 0.44 (L) 07/13/2018    CO2 26.0 07/13/2018    ALBUMIN 4.0 07/02/2018    CALCIUM 8.4 (L) 07/13/2018     No results found for: TSH   ...............................................................................................................................................Marland Kitchen  Modified Mayo Score for Ulcerative Colitis Stool Frequency:  1 = 1-2 more than normal  Rectal bleeding:   1 = Streaks of blood < 50% of time  Physicians Global Assessment:   1 = Mild colitis  Score: 3  Remission <2  ...........................................................................................................................................Marland Kitchen      Diagnosis ICD-10-CM Associated Orders  1. Ulcerative pancolitis without complication (CMS-HCC) K51.00 Sedimentation Rate     Ferritin     C-reactive protein     Iron & TIBC     Comprehensive Metabolic Panel     CBC w/ Differential     CBC w/ Differential     Comprehensive Metabolic Panel     Iron & TIBC     C-reactive protein     Ferritin     Sedimentation Rate     CBC w/ Differential           Assessment & Recommendations:   Disease state: Moderately active colitis    Brandy Moore is a 27 y.o. female with hx of Diabetes and ulcerative pancolitis since ~2008. She had previously been treated with mesalamine based therapies, and in spring 2018 we started full dose azathioprine.  She had progressive disease despite this with a moderate flare in spring 2019.  We started Remicade (and continued azathioprine) in March 2019.  However, she was hospitalized July 2018 with a severe colitis flare despite having received 4 doses of Remicade including dose optimization.  Hence, I think she had a legitimate primary nonresponse to Remicade.  Patient did not want to pursue colectomy.  We have started Tofacitinib, which is now approved through insurance on appeal.  She appears to be doing well at the moment on high-dose prednisone and Tofacitinib.    In light of her diabetes and past prednisone exposure, I recommended a faster prednisone taper for her today.  We will hope to continue Tofacitinib for long-term control of her ulcerative colitis.  If she continues to feel well, I would like to repeat a sigmoidoscopy in 4 to 5 months to assess for healing.    PLAN:  1.  Continue Xeljanz 10mg  twice daily  2. Update lab work today  3.  Finish course of Valganciclovir  4.  Recommend speeding up the prednisone taper:  50mg  for 5 days, 40mg  for 5 days, 30mg  for 5 days, 20mg  for 5 days, then 15mg  for 5 days, then 10mg  for 5 days, then 5mg  for 5 days, then stop.     5.  Recommend taking at least 1000mg  of calcium AND 600 IU of Vitamin D daily (in divided doses).    6.  DM - off metformin.  I provided a one month refill and asked her to follow up with local PCP for ongoing DM management.   7.  Please call [employer] to check on the insurance status  8.  Follow up in ~ 2months.   --------------------------------------------  Author: Zetta Bills 08/08/2018 12:10 PM     Today, I personally spent 50 minutes in direct face to face time with the patient, of which greater than 50% of the time was spent in patient education and counseling, including detailed discussions regarding management of her ulcerative colitis going forward, long-term management options including Harriette Ohara, multiple details regarding the cell source program and long-term cost and approval of medications.  We also discussed follow-up sigmoidoscopy in a few months to assess for healing.  We also discussed impact of prednisone on her skin as well as her blood sugar and the importance of close diabetes management.  All of this discussion was conducted via in person Spanish interpreter.    Zetta Bills, MD  Assistant Professor of Medicine  Division of Gastroenterology & Hepatology  [facility]

## 2018-08-08 NOTE — Unmapped (Signed)
Patient has some unclear coverage issues that we tried to work through today.     1. Appeal has been approved for xeljanz and patient will call [employer] [facility] to order next shipment.  They have scheduled for next delivery on 08/08/2018.     2. Patient has [employer] coverage but does not pay a premium and does not know how she currently has this coverage, has not been sent an ID card.  She called [employer] recently and they confirmed that she has active coverage. Reviewed plan and it may be that she was signed up for the ACA through [facility] and has an income based subsidy so she does not have to pay a premium.  Benefit verification says this coverage will term 08/31/2018, unclear if this is accurate.  Will likely know when [facility] tries to run Sept xelanz script.  If needed can go back to [employer] interim care program for continued medication coverage.

## 2018-09-03 NOTE — Unmapped (Signed)
Confirmed patient had all the required information to reorder Harriette Ohara directly from the [employer] assistance program.  Unenrolling from specialty pharmacy outreach calls at this time.

## 2018-10-10 ENCOUNTER — Ambulatory Visit: Admit: 2018-10-10 | Discharge: 2018-10-10 | Payer: MEDICAID

## 2018-10-10 DIAGNOSIS — K5100 Ulcerative (chronic) pancolitis without complications: Principal | ICD-10-CM

## 2018-10-10 DIAGNOSIS — Z789 Other specified health status: Secondary | ICD-10-CM

## 2018-10-10 DIAGNOSIS — Z598 Other problems related to housing and economic circumstances: Secondary | ICD-10-CM

## 2018-10-10 DIAGNOSIS — E119 Type 2 diabetes mellitus without complications: Secondary | ICD-10-CM

## 2018-10-10 DIAGNOSIS — D500 Iron deficiency anemia secondary to blood loss (chronic): Secondary | ICD-10-CM

## 2018-10-10 MED ORDER — TOFACITINIB 5 MG TABLET
ORAL_TABLET | Freq: Two times a day (BID) | ORAL | 3 refills | 0.00000 days
Start: 2018-10-10 — End: ?

## 2021-12-16 ENCOUNTER — Ambulatory Visit: Admit: 2021-12-16 | Discharge: 2021-12-19

## 2021-12-16 ENCOUNTER — Encounter: Admit: 2021-12-16 | Discharge: 2021-12-19 | Attending: Anesthesiology | Primary: Anesthesiology

## 2021-12-16 MED ORDER — PANTOPRAZOLE 40 MG TABLET,DELAYED RELEASE
ORAL_TABLET | Freq: Two times a day (BID) | ORAL | 0 refills | 30.00000 days
Start: 2021-12-16 — End: 2022-01-15

## 2021-12-16 MED ORDER — SUCRALFATE 100 MG/ML ORAL SUSPENSION
Freq: Four times a day (QID) | ORAL | 0 refills | 14.00000 days
Start: 2021-12-16 — End: 2021-12-30

## 2021-12-16 MED ORDER — CALCIUM CARBONATE 200 MG CALCIUM (500 MG) CHEWABLE TABLET
ORAL_TABLET | Freq: Two times a day (BID) | ORAL | 0 refills | 15.00000 days | PRN
Start: 2021-12-16 — End: 2022-01-15

## 2021-12-19 DIAGNOSIS — K51919 Ulcerative colitis, unspecified with unspecified complications: Principal | ICD-10-CM

## 2022-01-08 ENCOUNTER — Ambulatory Visit: Admit: 2022-01-08 | Discharge: 2022-01-09 | Payer: PRIVATE HEALTH INSURANCE

## 2022-01-08 DIAGNOSIS — R1013 Epigastric pain: Principal | ICD-10-CM

## 2022-01-08 DIAGNOSIS — K5100 Ulcerative (chronic) pancolitis without complications: Principal | ICD-10-CM

## 2022-01-08 DIAGNOSIS — K920 Hematemesis: Principal | ICD-10-CM

## 2022-01-08 MED ORDER — PANTOPRAZOLE 40 MG TABLET,DELAYED RELEASE
ORAL_TABLET | Freq: Every day | ORAL | 5 refills | 30 days | Status: CP
Start: 2022-01-08 — End: 2023-01-08

## 2022-05-01 ENCOUNTER — Ambulatory Visit: Admit: 2022-05-01 | Discharge: 2022-05-02

## 2022-05-01 DIAGNOSIS — K51919 Ulcerative colitis, unspecified with unspecified complications: Principal | ICD-10-CM

## 2022-05-01 DIAGNOSIS — R1114 Bilious vomiting: Principal | ICD-10-CM

## 2022-05-01 DIAGNOSIS — K219 Gastro-esophageal reflux disease without esophagitis: Principal | ICD-10-CM

## 2022-05-01 MED ORDER — PANTOPRAZOLE 40 MG TABLET,DELAYED RELEASE
ORAL_TABLET | Freq: Two times a day (BID) | ORAL | 3 refills | 45 days | Status: CP
Start: 2022-05-01 — End: 2023-05-01

## 2022-07-09 MED ORDER — PEG 3350-ELECTROLYTES 236 GRAM-22.74 GRAM-6.74 GRAM-5.86 GRAM SOLUTION
Freq: Once | ORAL | 0 refills | 1 days | Status: CP
Start: 2022-07-09 — End: 2022-07-09

## 2022-07-23 ENCOUNTER — Ambulatory Visit: Admit: 2022-07-23 | Discharge: 2022-07-23 | Payer: PRIVATE HEALTH INSURANCE

## 2022-07-23 ENCOUNTER — Encounter
Admit: 2022-07-23 | Discharge: 2022-07-23 | Payer: PRIVATE HEALTH INSURANCE | Attending: Anesthesiology | Primary: Anesthesiology

## 2022-11-13 ENCOUNTER — Ambulatory Visit: Admit: 2022-11-13 | Discharge: 2022-11-14 | Payer: PRIVATE HEALTH INSURANCE

## 2022-11-13 DIAGNOSIS — R1114 Bilious vomiting: Principal | ICD-10-CM

## 2022-11-13 DIAGNOSIS — K51919 Ulcerative colitis, unspecified with unspecified complications: Principal | ICD-10-CM

## 2022-11-13 DIAGNOSIS — K219 Gastro-esophageal reflux disease without esophagitis: Principal | ICD-10-CM

## 2022-11-13 DIAGNOSIS — Z3169 Encounter for other general counseling and advice on procreation: Principal | ICD-10-CM

## 2022-11-16 MED ORDER — XELJANZ 10 MG TABLET
ORAL_TABLET | Freq: Two times a day (BID) | ORAL | 3 refills | 90 days | Status: CP
Start: 2022-11-16 — End: ?

## 2022-11-27 MED ORDER — XELJANZ 10 MG TABLET
ORAL_TABLET | Freq: Two times a day (BID) | ORAL | 3 refills | 90 days | Status: CP
Start: 2022-11-27 — End: ?

## 2022-11-28 DIAGNOSIS — K5100 Ulcerative (chronic) pancolitis without complications: Principal | ICD-10-CM

## 2023-05-14 ENCOUNTER — Ambulatory Visit: Admit: 2023-05-14 | Discharge: 2023-05-15 | Payer: PRIVATE HEALTH INSURANCE

## 2023-05-14 ENCOUNTER — Encounter: Admit: 2023-05-14 | Discharge: 2023-05-15 | Payer: PRIVATE HEALTH INSURANCE

## 2023-05-14 DIAGNOSIS — K219 Gastro-esophageal reflux disease without esophagitis: Principal | ICD-10-CM

## 2023-05-14 DIAGNOSIS — K5100 Ulcerative (chronic) pancolitis without complications: Principal | ICD-10-CM

## 2023-05-14 DIAGNOSIS — D500 Iron deficiency anemia secondary to blood loss (chronic): Principal | ICD-10-CM

## 2024-03-03 ENCOUNTER — Ambulatory Visit: Admit: 2024-03-03 | Discharge: 2024-03-03 | Payer: PRIVATE HEALTH INSURANCE

## 2024-03-03 DIAGNOSIS — K5100 Ulcerative (chronic) pancolitis without complications: Principal | ICD-10-CM

## 2024-03-16 ENCOUNTER — Inpatient Hospital Stay: Admit: 2024-03-16 | Discharge: 2024-03-17 | Payer: PRIVATE HEALTH INSURANCE

## 2024-03-28 ENCOUNTER — Encounter: Admit: 2024-03-28 | Discharge: 2024-03-29

## 2024-03-28 DIAGNOSIS — R197 Diarrhea, unspecified: Principal | ICD-10-CM

## 2024-03-29 ENCOUNTER — Encounter
Admit: 2024-03-29 | Discharge: 2024-04-02 | Disposition: A | Discharge status: Home / Self Care | Payer: Medicaid (Managed Care) | Admitting: Family

## 2024-03-29 ENCOUNTER — Encounter: Admit: 2024-03-29

## 2024-03-29 ENCOUNTER — Ambulatory Visit: Admit: 2024-03-29 | Discharge: 2024-04-02

## 2024-03-29 ENCOUNTER — Inpatient Hospital Stay
Admit: 2024-03-29 | Discharge: 2024-04-02 | Disposition: A | Discharge status: Home / Self Care | Payer: Medicaid (Managed Care) | Admitting: Family

## 2024-04-02 DIAGNOSIS — K51019 Ulcerative (chronic) pancolitis with unspecified complications: Principal | ICD-10-CM

## 2024-04-02 MED ORDER — MUPIROCIN 2 % TOPICAL OINTMENT
Freq: Two times a day (BID) | TOPICAL | 0 refills | 14 days | Status: CP
Start: 2024-04-02 — End: 2024-04-16
  Filled 2024-04-02: qty 22, 14d supply, fill #0

## 2024-04-02 MED ORDER — FAMOTIDINE 20 MG TABLET
ORAL_TABLET | Freq: Every day | ORAL | 0 refills | 34 days | Status: CP
Start: 2024-04-02 — End: 2024-05-06
  Filled 2024-04-02: qty 34, 34d supply, fill #0

## 2024-04-02 MED ORDER — CHOLECALCIFEROL (VITAMIN D3) 25 MCG (1,000 UNIT) TABLET
ORAL_TABLET | Freq: Every day | ORAL | 0 refills | 100 days | Status: CP
Start: 2024-04-02 — End: 2024-07-11
  Filled 2024-04-02: qty 100, 100d supply, fill #0

## 2024-04-02 MED ORDER — PREDNISONE 10 MG TABLET
ORAL_TABLET | ORAL | 0 refills | 34 days | Status: CP
Start: 2024-04-02 — End: 2024-05-06
  Filled 2024-04-02: qty 70, 34d supply, fill #0

## 2024-04-07 ENCOUNTER — Ambulatory Visit
Admit: 2024-04-07 | Discharge: 2024-04-07 | Payer: Medicaid (Managed Care) | Attending: Student in an Organized Health Care Education/Training Program | Primary: Student in an Organized Health Care Education/Training Program

## 2024-04-07 ENCOUNTER — Ambulatory Visit: Admit: 2024-04-07 | Discharge: 2024-04-07 | Payer: Medicaid (Managed Care)

## 2024-04-07 DIAGNOSIS — K51019 Ulcerative (chronic) pancolitis with unspecified complications: Principal | ICD-10-CM

## 2024-04-07 DIAGNOSIS — Z79899 Other long term (current) drug therapy: Principal | ICD-10-CM

## 2024-04-07 DIAGNOSIS — R21 Rash and other nonspecific skin eruption: Principal | ICD-10-CM

## 2024-04-07 DIAGNOSIS — A044 Other intestinal Escherichia coli infections: Principal | ICD-10-CM

## 2024-04-07 DIAGNOSIS — D84821 Immunosuppression due to drug therapy: Principal | ICD-10-CM

## 2024-04-24 ENCOUNTER — Encounter: Admit: 2024-04-24 | Discharge: 2024-04-24 | Payer: BLUE CROSS/BLUE SHIELD

## 2024-04-24 ENCOUNTER — Ambulatory Visit
Admit: 2024-04-24 | Discharge: 2024-04-24 | Payer: BLUE CROSS/BLUE SHIELD | Attending: Student in an Organized Health Care Education/Training Program | Primary: Student in an Organized Health Care Education/Training Program

## 2024-04-24 ENCOUNTER — Ambulatory Visit: Admit: 2024-04-24 | Discharge: 2024-04-24 | Payer: BLUE CROSS/BLUE SHIELD

## 2024-04-24 DIAGNOSIS — K51019 Ulcerative (chronic) pancolitis with unspecified complications: Principal | ICD-10-CM

## 2024-04-24 DIAGNOSIS — A044 Other intestinal Escherichia coli infections: Principal | ICD-10-CM

## 2024-04-24 DIAGNOSIS — R21 Rash and other nonspecific skin eruption: Principal | ICD-10-CM

## 2024-04-24 MED ORDER — CLOBETASOL 0.05 % TOPICAL OINTMENT
Freq: Two times a day (BID) | TOPICAL | 2 refills | 0.00000 days | Status: CP
Start: 2024-04-24 — End: 2025-04-24
  Filled 2024-04-24: qty 60, 30d supply, fill #0

## 2024-04-27 DIAGNOSIS — K51019 Ulcerative (chronic) pancolitis with unspecified complications: Principal | ICD-10-CM

## 2024-04-27 MED ORDER — USTEKINUMAB 90 MG/ML SUBCUTANEOUS SYRINGE
SUBCUTANEOUS | 2 refills | 56.00000 days | Status: CP
Start: 2024-04-27 — End: ?

## 2024-05-07 DIAGNOSIS — K51019 Ulcerative (chronic) pancolitis with unspecified complications: Principal | ICD-10-CM

## 2024-05-29 MED ORDER — PREDNISONE 5 MG TABLET
ORAL_TABLET | ORAL | 0 refills | 35.00000 days | Status: CP
Start: 2024-05-29 — End: 2024-07-03

## 2024-06-09 ENCOUNTER — Ambulatory Visit: Admit: 2024-06-09 | Discharge: 2024-06-09 | Payer: Medicaid (Managed Care)

## 2024-06-09 ENCOUNTER — Ambulatory Visit
Admit: 2024-06-09 | Discharge: 2024-06-09 | Payer: Medicaid (Managed Care) | Attending: Student in an Organized Health Care Education/Training Program | Primary: Student in an Organized Health Care Education/Training Program

## 2024-06-09 DIAGNOSIS — K51019 Ulcerative (chronic) pancolitis with unspecified complications: Principal | ICD-10-CM

## 2024-06-09 DIAGNOSIS — L2389 Allergic contact dermatitis due to other agents: Principal | ICD-10-CM

## 2024-06-09 DIAGNOSIS — R21 Rash and other nonspecific skin eruption: Principal | ICD-10-CM

## 2024-06-09 MED ORDER — DAPSONE 5 % TOPICAL GEL
Freq: Two times a day (BID) | TOPICAL | 3 refills | 0.00000 days | Status: CP
Start: 2024-06-09 — End: 2025-06-09

## 2024-06-09 MED ORDER — CLOBETASOL 0.05 % TOPICAL OINTMENT
Freq: Two times a day (BID) | TOPICAL | 5 refills | 0.00000 days | Status: CP
Start: 2024-06-09 — End: 2025-06-09

## 2024-07-16 DIAGNOSIS — K51019 Ulcerative (chronic) pancolitis with unspecified complications: Principal | ICD-10-CM

## 2024-07-16 MED ORDER — USTEKINUMAB 90 MG/ML SUBCUTANEOUS SYRINGE
SUBCUTANEOUS | 5 refills | 56.00000 days | Status: CP
Start: 2024-07-16 — End: ?

## 2024-10-28 DIAGNOSIS — K51019 Ulcerative (chronic) pancolitis with unspecified complications: Principal | ICD-10-CM
